# Patient Record
Sex: Male | Born: 1945 | Race: White | Hispanic: No | Marital: Married | Smoking: Former smoker
Health system: Southern US, Community
[De-identification: ages and names within clinical notes are randomized; demographics above are authoritative.]

## PROBLEM LIST (undated history)

## (undated) DIAGNOSIS — M353 Polymyalgia rheumatica: Secondary | ICD-10-CM

## (undated) DIAGNOSIS — H9190 Unspecified hearing loss, unspecified ear: Secondary | ICD-10-CM

## (undated) DIAGNOSIS — K635 Polyp of colon: Secondary | ICD-10-CM

## (undated) DIAGNOSIS — I480 Paroxysmal atrial fibrillation: Secondary | ICD-10-CM

## (undated) DIAGNOSIS — D219 Benign neoplasm of connective and other soft tissue, unspecified: Secondary | ICD-10-CM

## (undated) DIAGNOSIS — U071 COVID-19: Secondary | ICD-10-CM

## (undated) DIAGNOSIS — E785 Hyperlipidemia, unspecified: Secondary | ICD-10-CM

## (undated) DIAGNOSIS — E039 Hypothyroidism, unspecified: Secondary | ICD-10-CM

## (undated) DIAGNOSIS — K5730 Diverticulosis of large intestine without perforation or abscess without bleeding: Secondary | ICD-10-CM

## (undated) DIAGNOSIS — K648 Other hemorrhoids: Secondary | ICD-10-CM

## (undated) DIAGNOSIS — G4730 Sleep apnea, unspecified: Secondary | ICD-10-CM

## (undated) HISTORY — PX: INGUINAL HERNIA REPAIR: SHX194

## (undated) HISTORY — DX: Polyp of colon: K63.5

## (undated) HISTORY — DX: Sleep apnea, unspecified: G47.30

## (undated) HISTORY — PX: COLONOSCOPY W/ BIOPSIES: SHX1374

## (undated) HISTORY — DX: Hypothyroidism, unspecified: E03.9

## (undated) HISTORY — PX: ANKLE FUSION: SHX881

## (undated) HISTORY — DX: Paroxysmal atrial fibrillation: I48.0

## (undated) HISTORY — DX: Unspecified hearing loss, unspecified ear: H91.90

## (undated) HISTORY — DX: Other hemorrhoids: K64.8

## (undated) HISTORY — PX: OTHER SURGICAL HISTORY: SHX169

## (undated) HISTORY — DX: Hyperlipidemia, unspecified: E78.5

## (undated) HISTORY — DX: Diverticulosis of large intestine without perforation or abscess without bleeding: K57.30

## (undated) HISTORY — DX: Polymyalgia rheumatica: M35.3

## (undated) HISTORY — DX: Benign neoplasm of connective and other soft tissue, unspecified: D21.9

---

## 1998-08-04 ENCOUNTER — Other Ambulatory Visit: Admission: RE | Admit: 1998-08-04 | Discharge: 1998-08-04 | Payer: Self-pay | Admitting: Gastroenterology

## 1999-12-07 ENCOUNTER — Encounter: Payer: Self-pay | Admitting: Pulmonary Disease

## 1999-12-07 ENCOUNTER — Ambulatory Visit: Admission: RE | Admit: 1999-12-07 | Discharge: 1999-12-07 | Payer: Self-pay | Admitting: Family Medicine

## 2000-04-17 ENCOUNTER — Ambulatory Visit (HOSPITAL_BASED_OUTPATIENT_CLINIC_OR_DEPARTMENT_OTHER): Admission: RE | Admit: 2000-04-17 | Discharge: 2000-04-17 | Payer: Self-pay | Admitting: Pulmonary Disease

## 2000-04-17 ENCOUNTER — Encounter: Payer: Self-pay | Admitting: Pulmonary Disease

## 2004-06-18 ENCOUNTER — Ambulatory Visit: Payer: Self-pay | Admitting: Family Medicine

## 2004-06-26 ENCOUNTER — Ambulatory Visit: Payer: Self-pay | Admitting: Family Medicine

## 2004-07-09 ENCOUNTER — Ambulatory Visit: Payer: Self-pay | Admitting: Pulmonary Disease

## 2005-03-13 ENCOUNTER — Ambulatory Visit: Payer: Self-pay | Admitting: Family Medicine

## 2005-03-21 ENCOUNTER — Ambulatory Visit: Payer: Self-pay | Admitting: Family Medicine

## 2005-07-11 ENCOUNTER — Ambulatory Visit: Payer: Self-pay | Admitting: Emergency Medicine

## 2006-03-17 ENCOUNTER — Ambulatory Visit: Payer: Self-pay | Admitting: Family Medicine

## 2006-03-24 ENCOUNTER — Ambulatory Visit: Payer: Self-pay | Admitting: Family Medicine

## 2006-12-08 ENCOUNTER — Ambulatory Visit: Payer: Self-pay | Admitting: Pulmonary Disease

## 2007-01-05 ENCOUNTER — Ambulatory Visit: Payer: Self-pay | Admitting: Pulmonary Disease

## 2007-02-02 ENCOUNTER — Ambulatory Visit: Payer: Self-pay | Admitting: Pulmonary Disease

## 2007-05-28 DIAGNOSIS — G4733 Obstructive sleep apnea (adult) (pediatric): Secondary | ICD-10-CM | POA: Insufficient documentation

## 2007-06-03 ENCOUNTER — Ambulatory Visit: Payer: Self-pay | Admitting: Family Medicine

## 2007-06-03 LAB — CONVERTED CEMR LAB
Alkaline Phosphatase: 61 units/L (ref 39–117)
Basophils Relative: 0.5 % (ref 0.0–1.0)
Bilirubin, Direct: 0.2 mg/dL (ref 0.0–0.3)
CO2: 30 meq/L (ref 19–32)
Creatinine, Ser: 0.8 mg/dL (ref 0.4–1.5)
Eosinophils Relative: 0.9 % (ref 0.0–5.0)
GFR calc Af Amer: 126 mL/min
Glucose, Bld: 81 mg/dL (ref 70–99)
HCT: 44 % (ref 39.0–52.0)
HDL: 30.7 mg/dL — ABNORMAL LOW (ref >39.0)
Hemoglobin: 15.3 g/dL (ref 13.0–17.0)
Ketones, urine, test strip: NEGATIVE
LDL Cholesterol: 87 mg/dL (ref 0–99)
Lymphocytes Relative: 30.9 % (ref 12.0–46.0)
Monocytes Absolute: 0.4 10*3/uL (ref 0.2–0.7)
Monocytes Relative: 7.3 % (ref 3.0–11.0)
Neutro Abs: 3.4 10*3/uL (ref 1.4–7.7)
Neutrophils Relative %: 60.4 % (ref 43.0–77.0)
Nitrite: NEGATIVE
Potassium: 5.2 meq/L — ABNORMAL HIGH (ref 3.5–5.1)
RDW: 12.9 % (ref 11.5–14.6)
Sodium: 146 meq/L — ABNORMAL HIGH (ref 135–145)
Specific Gravity, Urine: 1.025
TSH: 1.6 microintl units/mL (ref 0.35–5.50)
Total Bilirubin: 0.9 mg/dL (ref 0.3–1.2)
Total Protein: 6.7 g/dL (ref 6.0–8.3)
VLDL: 18 mg/dL (ref 0–40)
WBC: 5.6 10*3/uL (ref 4.5–10.5)

## 2007-06-11 ENCOUNTER — Ambulatory Visit: Payer: Self-pay | Admitting: Family Medicine

## 2007-06-11 DIAGNOSIS — J309 Allergic rhinitis, unspecified: Secondary | ICD-10-CM | POA: Insufficient documentation

## 2007-08-28 ENCOUNTER — Ambulatory Visit: Payer: Self-pay | Admitting: Pulmonary Disease

## 2007-10-20 ENCOUNTER — Ambulatory Visit: Payer: Self-pay | Admitting: Family Medicine

## 2007-10-20 DIAGNOSIS — S43109A Unspecified dislocation of unspecified acromioclavicular joint, initial encounter: Secondary | ICD-10-CM | POA: Insufficient documentation

## 2007-10-27 ENCOUNTER — Ambulatory Visit: Payer: Self-pay | Admitting: Family Medicine

## 2007-12-11 ENCOUNTER — Ambulatory Visit (HOSPITAL_COMMUNITY): Admission: RE | Admit: 2007-12-11 | Discharge: 2007-12-11 | Payer: Self-pay | Admitting: Specialist

## 2007-12-31 ENCOUNTER — Encounter: Payer: Self-pay | Admitting: Family Medicine

## 2008-01-11 ENCOUNTER — Telehealth (INDEPENDENT_AMBULATORY_CARE_PROVIDER_SITE_OTHER): Payer: Self-pay | Admitting: *Deleted

## 2008-01-15 ENCOUNTER — Ambulatory Visit (HOSPITAL_COMMUNITY): Admission: RE | Admit: 2008-01-15 | Discharge: 2008-01-16 | Payer: Self-pay | Admitting: Specialist

## 2008-03-25 ENCOUNTER — Ambulatory Visit: Payer: Self-pay | Admitting: Internal Medicine

## 2008-04-08 ENCOUNTER — Ambulatory Visit: Payer: Self-pay | Admitting: Internal Medicine

## 2008-08-15 ENCOUNTER — Ambulatory Visit: Payer: Self-pay | Admitting: Family Medicine

## 2008-08-15 LAB — CONVERTED CEMR LAB
ALT: 27 units/L (ref 0–53)
AST: 27 units/L (ref 0–37)
Albumin: 3.9 g/dL (ref 3.5–5.2)
Alkaline Phosphatase: 62 units/L (ref 39–117)
BUN: 16 mg/dL (ref 6–23)
Basophils Relative: 0.3 % (ref 0.0–3.0)
Blood in Urine, dipstick: NEGATIVE
CO2: 30 meq/L (ref 19–32)
Chloride: 107 meq/L (ref 96–112)
Creatinine, Ser: 0.8 mg/dL (ref 0.4–1.5)
Eosinophils Relative: 0.7 % (ref 0.0–5.0)
Glucose, Bld: 100 mg/dL — ABNORMAL HIGH (ref 70–99)
Glucose, Urine, Semiquant: NEGATIVE
HCT: 43.2 % (ref 39.0–52.0)
HDL: 28.6 mg/dL — ABNORMAL LOW (ref >39.0)
MCV: 89.5 fL (ref 78.0–100.0)
Monocytes Relative: 7.3 % (ref 3.0–12.0)
Neutrophils Relative %: 59.2 % (ref 43.0–77.0)
Nitrite: NEGATIVE
PSA: 1.11 ng/mL (ref 0.10–4.00)
Platelets: 158 10*3/uL (ref 150–400)
Potassium: 5.3 meq/L — ABNORMAL HIGH (ref 3.5–5.1)
RBC: 4.82 M/uL (ref 4.22–5.81)
Specific Gravity, Urine: 1.02
Total CHOL/HDL Ratio: 4.4
Total Protein: 7.1 g/dL (ref 6.0–8.3)
VLDL: 12 mg/dL (ref 0–40)
WBC Urine, dipstick: NEGATIVE
WBC: 4.9 10*3/uL (ref 4.5–10.5)
pH: 6

## 2008-08-22 ENCOUNTER — Ambulatory Visit: Payer: Self-pay | Admitting: Family Medicine

## 2009-03-24 ENCOUNTER — Ambulatory Visit: Payer: Self-pay | Admitting: Pulmonary Disease

## 2009-03-27 ENCOUNTER — Telehealth (INDEPENDENT_AMBULATORY_CARE_PROVIDER_SITE_OTHER): Payer: Self-pay | Admitting: *Deleted

## 2009-04-18 ENCOUNTER — Encounter: Payer: Self-pay | Admitting: Pulmonary Disease

## 2009-05-12 ENCOUNTER — Encounter: Payer: Self-pay | Admitting: Pulmonary Disease

## 2009-10-09 ENCOUNTER — Ambulatory Visit: Payer: Self-pay | Admitting: Family Medicine

## 2010-05-25 ENCOUNTER — Ambulatory Visit: Payer: Self-pay | Admitting: Pulmonary Disease

## 2010-05-28 ENCOUNTER — Ambulatory Visit: Payer: Self-pay | Admitting: Family Medicine

## 2010-05-28 ENCOUNTER — Encounter: Payer: Self-pay | Admitting: Family Medicine

## 2010-05-28 DIAGNOSIS — N529 Male erectile dysfunction, unspecified: Secondary | ICD-10-CM | POA: Insufficient documentation

## 2010-05-28 LAB — CONVERTED CEMR LAB
Bilirubin Urine: NEGATIVE
Nitrite: NEGATIVE
Protein, U semiquant: NEGATIVE
Urobilinogen, UA: 0.2
WBC Urine, dipstick: NEGATIVE

## 2010-05-29 LAB — CONVERTED CEMR LAB
ALT: 32 units/L (ref 0–53)
AST: 29 units/L (ref 0–37)
BUN: 17 mg/dL (ref 6–23)
Bilirubin, Direct: 0.1 mg/dL (ref 0.0–0.3)
Cholesterol: 164 mg/dL (ref 0–200)
Eosinophils Relative: 1.5 % (ref 0.0–5.0)
GFR calc non Af Amer: 102.62 mL/min (ref >60)
HCT: 46 % (ref 39.0–52.0)
LDL Cholesterol: 112 mg/dL — ABNORMAL HIGH (ref 0–99)
Lymphs Abs: 1.9 10*3/uL (ref 0.7–4.0)
Monocytes Relative: 8 % (ref 3.0–12.0)
Neutrophils Relative %: 59.8 % (ref 43.0–77.0)
Platelets: 173 10*3/uL (ref 150.0–400.0)
Potassium: 5.3 meq/L — ABNORMAL HIGH (ref 3.5–5.1)
RBC: 5 M/uL (ref 4.22–5.81)
Total Bilirubin: 0.6 mg/dL (ref 0.3–1.2)
Total CHOL/HDL Ratio: 4
VLDL: 15 mg/dL (ref 0.0–40.0)
WBC: 6.2 10*3/uL (ref 4.5–10.5)

## 2010-08-28 NOTE — Assessment & Plan Note (Signed)
Summary: COUGH, CONGESTION, LG FEVER // RS   Vital Signs:  Patient profile:   65 year old male Weight:      196 pounds Temp:     99.2 degrees F oral BP sitting:   110 / 80  (left arm)  Vitals Entered By: Kern Reap CMA Duncan Dull) (October 09, 2009 11:35 AM)  Reason for Visit cough  Primary Care Provider:  Tawanna Cooler   History of Present Illness: Jeremy Cardenas is a 65 year old, married male, nonsmoker, and comes in today for evaluation of a cough x 2 weeks.  His daughter and wife had mycoplasma.   A set cough with no fever, chills, earache, sore throat for about two weeks.  Over the weekend, he developed some chills and a temperature of 100 degrees.  Review of systems negative  Allergies: No Known Drug Allergies  Past History:  Past medical, surgical, family and social histories (including risk factors) reviewed for relevance to current acute and chronic problems.  Past Medical History: Reviewed history from 06/11/2007 and no changes required. Sleep Apnea Allergic rhinitis  Past Surgical History: Reviewed history from 03/24/2009 and no changes required. Rotator cuff repair o9  Family History: Reviewed history from 03/24/2007 and no changes required. Family History Breast cancer 1st degree relative <50 Family History Hypertension Family History of Stroke M 1st degree relative <50 Family History of Cardiovascular disorder  Social History: Reviewed history from 08/22/2008 and no changes required. Married Former Smoker Alcohol use-yes Drug use-no Retired Regular exercise-yes part-time at [employer]  Review of Systems      See HPI  Physical Exam  General:  Well-developed,well-nourished,in no acute distress; alert,appropriate and cooperative throughout examination Head:  Normocephalic and atraumatic without obvious abnormalities. No apparent alopecia or balding. Eyes:  No corneal or conjunctival inflammation noted. EOMI. Perrla. Funduscopic exam benign, without  hemorrhages, exudates or papilledema. Vision grossly normal. Ears:  External ear exam shows no significant lesions or deformities.  Otoscopic examination reveals clear canals, tympanic membranes are intact bilaterally without bulging, retraction, inflammation or discharge. Hearing is grossly normal bilaterally. Nose:  External nasal examination shows no deformity or inflammation. Nasal mucosa are pink and moist without lesions or exudates. Mouth:  Oral mucosa and oropharynx without lesions or exudates.  Teeth in good repair. Neck:  No deformities, masses, or tenderness noted. Chest Wall:  No deformities, masses, tenderness or gynecomastia noted. Lungs:  crackles right base   Problems:  Medical Problems Added: 1)  Dx of Cough  (ICD-786.2)  Impression & Recommendations:  Problem # 1:  COUGH (ICD-786.2) Assessment New  Complete Medication List: 1)  Adult Aspirin Ec Low Strength 81 Mg Tbec (Aspirin) .... Once daily 2)  Saw Palmetto 160 Mg Caps (Saw palmetto (serenoa repens)) .... Take 1 tablet by mouth two times a day 3)  Red Yeast Rice, Vit E  .... Once daily 4)  Cvs Stool Softener 100 Mg Caps (Docusate sodium) .... Three times a day 5)  Eql Fish Oil 1000 Mg Caps (Omega-3 fatty acids) .... Once daily 6)  Glucosamine 1500 Complex Caps (Glucosamine-chondroit-vit c-mn) .... Once daily 7)  Doxycycline Hyclate 100 Mg Caps (Doxycycline hyclate) .... Take 1 tablet by mouth two times a day  Patient Instructions: 1)  begin doxycycline 100 mg b.i.d. x 2 weeks.  Return p.r.n. Prescriptions: DOXYCYCLINE HYCLATE 100 MG CAPS (DOXYCYCLINE HYCLATE) Take 1 tablet by mouth two times a day  #30 x 1   Entered and Authorized by:   Roderick Pee MD  Signed by:   Roderick Pee MD on 10/09/2009   Method used:   Print then Give to Patient   RxID:   [id number]

## 2010-08-28 NOTE — Assessment & Plan Note (Signed)
Summary: 1 year / mbw   Primary Provider/Referring Provider:  Tawanna Cooler   History of Present Illness: 64/M  for F/U for OSA.  Well maintained on CPAP 8 cm H2O, nasal pillows with chin strap.  Compliant with CPAP.  No daytime hypersomnolence.  Last seen by DS in 1/09. PSg was performed in 2001.5-10 lb wt gain since then. Epworth Sleepiness Score 6 Uses flonase at bedtime. Reviewed PSG 5/01 >> baseline RDI 52/h corrected by CPAP 7 cm 9/01  May 25, 2010 9:15 AM  feels well, mask ok, pressure ok, wt unchanged, denies excessive daytime somnolence, snoring nasonex working ok.  Preventive Screening-Counseling & Management  Alcohol-Tobacco     Alcohol drinks/day: <1     Alcohol type: wine     Smoking Status: quit     Packs/Day: 1.0     Year Started: 1961     Year Quit: 1984  Allergies (verified): No Known Drug Allergies  Past History:  Past Medical History: Last updated: 06/11/2007 Sleep Apnea Allergic rhinitis  Social History: Last updated: 08/22/2008 Married Former Smoker Alcohol use-yes Drug use-no Retired Regular exercise-yes part-time at [employer]  Review of Systems  The patient denies anorexia, fever, weight loss, weight gain, vision loss, decreased hearing, hoarseness, chest pain, syncope, dyspnea on exertion, peripheral edema, prolonged cough, headaches, hemoptysis, abdominal pain, melena, hematochezia, severe indigestion/heartburn, hematuria, muscle weakness, suspicious skin lesions, difficulty walking, depression, unusual weight change, abnormal bleeding, enlarged lymph nodes, and angioedema.    Vital Signs:  Patient profile:   65 year old male Height:      68.25 inches Weight:      202.2 pounds BMI:     30.63 O2 Sat:      95 % on Room air Temp:     97.5 degrees F oral Pulse rate:   70 / minute BP sitting:   110 / 62  (right arm) Cuff size:   regular  Vitals Entered By: Zackery Barefoot CMA (May 25, 2010 9:10 AM)  O2 Flow:  Room  air Comments Medications reviewed with patient Verified contact number and pharmacy with patient Zackery Barefoot CMA  May 25, 2010 9:11 AM    Physical Exam  Additional Exam:  Gen. Pleasant, well-nourished, in no distress ENT - no lesions, no post nasal drip Neck: No JVD, no thyromegaly, no carotid bruits Lungs: no use of accessory muscles, no dullness to percussion, clear without rales or rhonchi  Cardiovascular: Rhythm regular, heart sounds  normal, no murmurs or gallops, no peripheral edema Musculoskeletal: No deformities, no cyanosis or clubbing      Impression & Recommendations:  Problem # 1:  OBSTRUCTIVE SLEEP APNEA (ICD-327.23)  Compliance encouraged, wt loss emphasized, asked to avoid meds with sedative side effects, cautioned against driving when sleepy.  CMN will be renewed. ct CPAP 8 cm  Orders: Est. Patient Level III (60454)  Medications Added to Medication List This Visit: 1)  Red Yeast Rice 600 Mg Caps (Red yeast rice extract) .... Take 2 tablet by mouth two times a day 2)  Nasonex 50 Mcg/act Susp (Mometasone furoate) .... One spray each nostril once daily  Patient Instructions: 1)  Copy sent to: dr todd 2)  Please schedule a follow-up appointment in 1 year.   Not Administered:    Influenza Vaccine not given due to: declined

## 2010-08-28 NOTE — Assessment & Plan Note (Signed)
Summary: emp---will fast//ccm   Vital Signs:  Patient profile:   65 year old male Height:      68 inches Weight:      201 pounds Temp:     98.4 degrees F oral BP sitting:   110 / 74  (left arm) Cuff size:   regular  Vitals Entered By: Kern Reap CMA Duncan Dull) (May 28, 2010 8:35 AM) CC: annual wellness exam Is Patient Diabetic? No Pain Assessment Patient in pain? no        Primary Care Provider:  Tawanna Cooler  CC:  annual wellness exam.  History of Present Illness: Jeremy Cardenas is a 65 year old male, married, nonsmoker, who comes in today for general Medicare wellness exam.  As always been in excellent health.  Has had no chronic health problems.  He takes an aspirin tablet daily, and steroid nasal spray p.r.n. for allergic rhinitis.  He gets routine eye care, dental care, colonoscopy, normal, tetanus, 2010, seasonal flu and Pneumovax today, information given on shingles, he is requesting a prescription for V.  Here for Medicare AWV:  1.   Risk factors based on Past M, S, F history:...reviewed no changes 2.   Physical Activities: walks daily 3.   Depression/mood: good mood.  No depression 4.   Hearing: normal 5.   ADL's: normal 6.   Fall Risk: reviewed and identified 7.   Home Safety: normal.  No guns in the house 8.   Height, weight, &visual acuity:height weight, normal annual eye exam 9.   Counseling: continue good health habits 10.   Labs ordered based on risk factors: done today 11.           Referral Coordination.........none indicated 12.           Care Plan.........continue current program 13.            Cognitive Assessment .......Marland Kitchenoriented x 3 does all his own finances independently  Allergies: No Known Drug Allergies  Past History:  Past medical, surgical, family and social histories (including risk factors) reviewed, and no changes noted (except as noted below).  Past Medical History: Reviewed history from 06/11/2007 and no changes required. Sleep  Apnea Allergic rhinitis  Past Surgical History: Reviewed history from 03/24/2009 and no changes required. Rotator cuff repair o9  Family History: Reviewed history from 03/24/2007 and no changes required. Family History Breast cancer 1st degree relative <50 Family History Hypertension Family History of Stroke M 1st degree relative <50 Family History of Cardiovascular disorder  Social History: Reviewed history from 08/22/2008 and no changes required. Married Former Smoker Alcohol use-yes Drug use-no Retired Regular exercise-yes part-time at [employer]  Review of Systems      See HPI       Flu Vaccine Consent Questions     Do you have a history of severe allergic reactions to this vaccine? no    Any prior history of allergic reactions to egg and/or gelatin? no    Do you have a sensitivity to the preservative Thimersol? no    Do you have a past history of Guillan-Barre Syndrome? no    Do you currently have an acute febrile illness? no    Have you ever had a severe reaction to latex? no    Vaccine information given and explained to patient? yes    Are you currently pregnant? no    Lot Number:[id number]   Exp Date:01/26/2011   Site Given  Left Deltoid IM   Physical Exam  General:  Well-developed,well-nourished,in no  acute distress; alert,appropriate and cooperative throughout examination Head:  Normocephalic and atraumatic without obvious abnormalities. No apparent alopecia or balding. Eyes:  No corneal or conjunctival inflammation noted. EOMI. Perrla. Funduscopic exam benign, without hemorrhages, exudates or papilledema. Vision grossly normal. Ears:  External ear exam shows no significant lesions or deformities.  Otoscopic examination reveals clear canals, tympanic membranes are intact bilaterally without bulging, retraction, inflammation or discharge. Hearing is grossly normal bilaterally. Nose:  External nasal examination shows no deformity or inflammation. Nasal  mucosa are pink and moist without lesions or exudates. Mouth:  Oral mucosa and oropharynx without lesions or exudates.  Teeth in good repair. Neck:  No deformities, masses, or tenderness noted. Chest Wall:  No deformities, masses, tenderness or gynecomastia noted. Breasts:  No masses or gynecomastia noted Lungs:  Normal respiratory effort, chest expands symmetrically. Lungs are clear to auscultation, no crackles or wheezes. Heart:  Normal rate and regular rhythm. S1 and S2 normal without gallop, murmur, click, rub or other extra sounds. Abdomen:  Bowel sounds positive,abdomen soft and non-tender without masses, organomegaly or hernias noted. Rectal:  No external abnormalities noted. Normal sphincter tone. No rectal masses or tenderness. Genitalia:  Testes bilaterally descended without nodularity, tenderness or masses. No scrotal masses or lesions. No penis lesions or urethral discharge. Prostate:  Prostate gland firm and smooth, no enlargement, nodularity, tenderness, mass, asymmetry or induration. Msk:  No deformity or scoliosis noted of thoracic or lumbar spine.   Pulses:  R and L carotid,radial,femoral,dorsalis pedis and posterior tibial pulses are full and equal bilaterally Extremities:  No clubbing, cyanosis, edema, or deformity noted with normal full range of motion of all joints.   Neurologic:  No cranial nerve deficits noted. Station and gait are normal. Plantar reflexes are down-going bilaterally. DTRs are symmetrical throughout. Sensory, motor and coordinative functions appear intact. Skin:  Intact without suspicious lesions or rashes Cervical Nodes:  No lymphadenopathy noted Axillary Nodes:  No palpable lymphadenopathy Inguinal Nodes:  No significant adenopathy Psych:  Cognition and judgment appear intact. Alert and cooperative with normal attention span and concentration. No apparent delusions, illusions, hallucinations   Impression & Recommendations:  Problem # 1:  PHYSICAL  EXAMINATION, NORMAL (ICD-V70.0) Assessment Unchanged  Orders: Prescription Created Electronically [phone]) Medicare -1st Annual Wellness Visit [phone]) Urinalysis-dipstick only (Medicare patient) ([id number]) Venipuncture (41660) TLB-Lipid Panel (80061-LIPID) TLB-BMP (Basic Metabolic Panel-BMET) (80048-METABOL) TLB-CBC Platelet - w/Differential (85025-CBCD) TLB-Hepatic/Liver Function Pnl (80076-HEPATIC) TLB-TSH (Thyroid Stimulating Hormone) (84443-TSH) TLB-PSA (Prostate Specific Antigen) (84153-PSA) Specimen Handling (63016) EKG w/ Interpretation (93000)  Problem # 2:  ALLERGIC RHINITIS (ICD-477.9) Assessment: Improved  The following medications were removed from the medication list:    Nasonex 50 Mcg/act Susp (Mometasone furoate) ..... One spray each nostril once daily His updated medication list for this problem includes:    Flonase 50 Mcg/act Susp (Fluticasone propionate) ..... Uad  Orders: Prescription Created Electronically [phone]) Medicare -1st Annual Wellness Visit [phone]) Urinalysis-dipstick only (Medicare patient) [phone]) Venipuncture [phone]) TLB-Lipid Panel (80061-LIPID) TLB-BMP (Basic Metabolic Panel-BMET) (80048-METABOL) TLB-CBC Platelet - w/Differential (85025-CBCD) TLB-Hepatic/Liver Function Pnl (80076-HEPATIC) TLB-TSH (Thyroid Stimulating Hormone) (84443-TSH) TLB-PSA (Prostate Specific Antigen) (84153-PSA) Specimen Handling (37628)  Complete Medication List: 1)  Adult Aspirin Ec Low Strength 81 Mg Tbec (Aspirin) .... Once daily 2)  Saw Palmetto 160 Mg Caps (Saw palmetto (serenoa repens)) .... Take 1 tablet by mouth two times a day 3)  Red Yeast Rice 600 Mg Caps (Red yeast rice extract) .... Take 2 tablet by mouth two times a day 4)  Cvs Stool  Softener 100 Mg Caps (Docusate sodium) .... Three times a day 5)  Eql Fish Oil 1000 Mg Caps (Omega-3 fatty acids) .... Once daily 6)  Glucosamine 1500 Complex Caps (Glucosamine-chondroit-vit c-mn) .... Once daily 7)   Flonase 50 Mcg/act Susp (Fluticasone propionate) .... Uad 8)  Viagra 100 Mg Tabs (Sildenafil citrate) .... Uad  Other Orders: Flu Vaccine 79yrs + MEDICARE PATIENTS (D6387) Administration Flu vaccine - MCR (F6433) Pneumococcal Vaccine (29518) Admin 1st Vaccine (84166)  Patient Instructions: 1)  Please schedule a follow-up appointment in 1 year. 2)  It is important that you exercise regularly at least 20 minutes 5 times a week. If you develop chest pain, have severe difficulty breathing, or feel very tired , stop exercising immediately and seek medical attention. 3)  Take an Aspirin every day. Prescriptions: VIAGRA 100 MG TABS (SILDENAFIL CITRATE) UAD  #6 x 11   Entered and Authorized by:   Roderick Pee MD   Signed by:   Roderick Pee MD on 05/28/2010   Method used:   Electronically to        [facility]  [address] [id number] (retail)       [address]       [place], [place]  [address]       Ph: [phone] or [phone]       Fax: [phone]   RxID:   [id number] FLONASE 50 MCG/ACT SUSP (FLUTICASONE PROPIONATE) UAD  #3 units x 4   Entered and Authorized by:   Roderick Pee MD   Signed by:   Roderick Pee MD on 05/28/2010   Method used:   [facility]ically to        [facility]  [address] [id number] ([facility]       [address]       [place], [place]  [address]       Ph: [phone] or [phone]       Fax: [phone]   RxID:   [id number]    Orders Added: 1)  Prescription Created Electronically [phone] 2)  Medicare -1st Annual Wellness Visit [G0438] 3)  Urinalysis-dipstick only (Medicare patient) [[id number]] 4)  Venipuncture [36415] 5)  TLB-Lipid Panel [80061-LIPID] 6)  TLB-BMP (Basic Metabolic Panel-BMET) [80048-METABOL] 7)  TLB-CBC Platelet - w/Differential [85025-CBCD] 8)  TLB-Hepatic/Liver Function Pnl [80076-HEPATIC] 9)  TLB-TSH (Thyroid Stimulating Hormone) [84443-TSH] 10)  TLB-PSA (Prostate Specific Antigen) [84153-PSA] 11)   Specimen Handling [99000] 12)  Flu Vaccine 65yrs + MEDICARE PATIENTS [Q2039] 13)  Administration Flu vaccine - MCR [G0008] 14)  EKG w/ Interpretation [93000] 15)  Pneumococcal Vaccine [90732] 16)  Admin 1st Vaccine [76195]   Immunizations Administered:  Pneumonia Vaccine:    Vaccine Type: Pneumovax    Site: right deltoid    Mfr: Merck    Dose: 0.5 ml    Route: IM    Given by: Kern Reap CMA (AAMA)    Exp. Date: 11/20/2011    Lot #: [id number]    Physician counseled: yes   Immunizations Administered:  Pneumonia Vaccine:    Vaccine Type: Pneumovax    Site: right deltoid    Mfr: Merck    Dose: 0.5 ml    Route: IM    Given by: Kern Reap CMA (AAMA)    Exp. Date: 11/20/2011    Lot #: [id number]    Physician counseled: yes   Laboratory Results   Urine Tests  Date/Time Recieved: May 28, 2010 11:25 AM  Date/Time Reported: May 28, 2010 11:25 AM   Routine Urinalysis   Color: yellow Appearance: Clear Glucose: negative   (Normal Range: Negative) Bilirubin: negative   (Normal Range: Negative) Ketone: negative   (Normal Range: Negative) Spec. Gravity: 1.020   (Normal Range: 1.003-1.035) Blood: negative   (Normal Range: Negative) pH: 7.0   (Normal Range: 5.0-8.0) Protein: negative   (Normal Range: Negative) Urobilinogen: 0.2   (Normal Range: 0-1) Nitrite: negative   (Normal Range: Negative) Leukocyte Esterace: negative   (Normal Range: Negative)    Comments: Wynona Canes, CMA  May 28, 2010 11:25 AM

## 2010-12-11 NOTE — Op Note (Signed)
NAME:  Jeremy Cardenas, Jeremy Cardenas NO.:  [id number]   MEDICAL RECORD NO.:  [id number]          PATIENT TYPE:  AMB   LOCATION:  DAY                          FACILITY:  [facility]   PHYSICIAN:  Jene Every, M.D.    DATE OF BIRTH:  1945/08/14   DATE OF PROCEDURE:  01/15/2008  DATE OF DISCHARGE:                               OPERATIVE REPORT   PREOPERATIVE DIAGNOSIS:  Mass of rotator cuff tear of the right  shoulder.   POSTOPERATIVE DIAGNOSIS:  Mass of rotator cuff tear of the right  shoulder.   PROCEDURE PERFORMED:  1. Open rotator cuff repair.  2. Subacromial decompression with augmentation of rotator cuff with a      TissueMend patch graft.   ANESTHESIA:  General.   ASSISTANT:  Roma Schanz, P.A.   BLOOD LOSS:  Minimal.   COMPLICATIONS:  None.   BRIEF HISTORY AND INDICATIONS:  The patient is a 65 year old with a  rotator cuff tear, retracted, but no evidence of atrophy to the  glenohumeral joint.  He had weakness and then pain.  He is indicated for  repair of the rotator cuff.  We discussed risks and benefits including  bleeding, infection, inability to repair, need for a patch graft,  prolonged immobilization, need for hemiarthroplasty, etc., in the future  as well as infection, perioperative complications, etc.   TECHNIQUE:  The patient was placed in the supine beach-chair position.  After the induction of adequate anesthesia and 1 g of Kefzol, the right  shoulder and upper extremity was prepped and draped in the usual sterile  fashion.  Surgical incisions were  made over the anterolateral aspect of  the acromion.  Subcutaneous tissue was dissected.  Electrocautery was  utilized to achieve hemostasis.  The raphe between the anterolateral  heads was identified and divided for approximately 3-cm and  subperiosteal elevated from the anterolateral and anteromedial aspect of  the acromion.  The CA ligament was divided.  We preserved the attachment  of the deltoid.   Noted was a retracted tear of the rotator cuff with  exposed the articular surface of the humerus.  The joint was copiously  irrigated.  We retracted the supraspinatus and the subscap, little to no  of the infraspinatus.  The cuff was digitally mobilized.  A bed was then  fashioned between the articular surface laterally and the greater  tuberosity with a Matt Holmes rongeur.  We also removed a centimeter of the  lateral aspect of the articular surface to improve the surface area and  the footprint for the rotator cuff tendon, as it was somewhat attenuated  and retracted.  I then placed 2 suture anchors at the native articular  surface edge.  The tendon, especially the posterior aspect of  supraspinatus tendon, was attenuated.  I felt that this would, if a  successful repair was to be obtained, require augmentation with a graft  using a TissueMend graft.  This was reconstituted appropriately.  I  placed in the suture anchors and threaded the 2 arms of the sutures  through the tendon at that surface.  This was then tied  with a surgeon's  knot with the arm in the abducted position.  Then, with a free needle, I  threaded this through the contoured TissueMend and the graft was then  pushed down over the suture arms.  They were crossed and then I laid  them down over the rotator cuff and the TissueMend and then secured them  over the lateral aspect of the greater tuberosity with 2 PushLock suture  anchors after tapping with a 3/5 awl and inserting the eyelets as they  crossed into each and tapping them into the channel formed by the awl  with excellent purchase.  This created a double row fixation for the  rotator cuff and secured the TissueMend underneath it.  There was  satisfactory coverage there actually in the supraspinatus medially and  the subscap.  This was then reinforced with 3-0 Vicryl interrupted  simple sutures to secure the lateral aspects of the TissueMend with the  native cuff.  The  wound was copiously irrigated.  I gently took the arm  down to the side and then abducted it to 90 degrees with preservation of  the continuity of the repair.  I then repaired the raphe with  #1-Vicryl  interrupted figure-of-eight sutures over and through the acromion  securing the deltoid.  The subcutaneous tissue was reapproximated with 2-  0 Vicryl simple sutures and the skin was reapproximated with 4-0  subcuticular Prolene.  The wound was reinforced with Steri-Strips,  sterile dressing applied and he was placed on a large abduction pillow,  extubated without difficulty and transported to the recovery room in  satisfactory condition.  The patient tolerated the procedure well.            Jene Every, M.D.  Electronically Signed     JB/MEDQ  D:  01/15/2008  T:  01/15/2008  Job:  [id number]

## 2010-12-11 NOTE — Assessment & Plan Note (Signed)
Jeremy Cardenas                             PULMONARY OFFICE NOTE   Jeremy Cardenas, Jeremy Cardenas                    MRN:          [id number]  DATE:12/08/2006                            DOB:          March 01, 1946    PROBLEM:  Obstructive sleep apnea.   HISTORY OF PRESENT ILLNESS:  Jeremy Cardenas is a 65 year old gentleman who  I last saw several years ago for obstructive sleep apnea. He was also  seen by Dr. Delton Coombes in December 2006 which is his last encounter with  [facility]. He has been maintained on nasal CPAP at 8 cmH2O  pressure. He wears nasal pillows. He had equipment malfunction and  contacted [facility] to get replacement. They noted that it had  been greater than 1 year since his last evaluation and recommended  followup with [place]. He states that his obstructive sleep apnea has been  extremely well controlled with the nasal CPAP and he has been notably  compliant with this. He has not used any in the past 4 nights and has  noted some increased daytime hypersomnolence.   PAST MEDICAL HISTORY:  Notable for sleep and allergies.   CURRENT MEDICATIONS:  He only uses Nasonex.   SOCIAL HISTORY:  Remote smoking history and quit in 1983. No history of  significant alcohol abuse. No history of significant occupational  exposures.   FAMILY HISTORY:  This has been reviewed on the intake form and is  noncontributory.   REVIEW OF SYSTEMS:  A detailed review of systems has been performed and  is noncontributory.   PHYSICAL EXAMINATION:  VITAL SIGNS:  Temperature is 97.6, blood pressure  114/62, pulse 70 and regular, respirations 16 and unlabored, room air  oxygen saturation is 96%.  GENERAL:  He is well-developed, well-nourished in no acute cardiac or  respiratory distress. His weight today is 191 pounds which is down 5  pounds from our last encounter.  HEENT:  No acute abnormalities.  NECK:  Supple without adenopathy or jugular venous  distention.  CHEST:  Reveals full breath sounds with no adventitious sounds,  percussion note is normal.  CARDIAC:  Reveals a regular rate and rhythm with no murmurs.  ABDOMEN:  Soft, nontender with normal bowel sounds.  EXTREMITIES:  Without clubbing, cyanosis or edema.  NEUROLOGIC:  Reveals no focal deficits.   LABORATORY DATA:  A polysomnogram from 2001 revealed severe obstructive  sleep apnea. CPAP titration study around that same time demonstrates  excellent control of the sleep apnea with 8 cmH2O pressure.   IMPRESSION:  Obstructive sleep apnea - well controlled on nasal CPAP  using the nasal pillows. Now with equipment malfunction requiring  replacement.   PLAN:  We will contact [facility] to replace his equipment. He  needs a chin strap and a reevaluation of his machine as well as nasal  pillow device. Because it has been so many years since his last study,  we will also have them perform an autotitration study for 2 weeks to  ensure that 8 cmH2O pressure is still the proper setting.     Jeremy Hua  Ree Kida, MD  Electronically Signed    DBS/MedQ  DD: 12/24/2006  DT: 12/24/2006  Job #: [id number]

## 2010-12-11 NOTE — Assessment & Plan Note (Signed)
[facility]                             PULMONARY OFFICE NOTE   DAARON, DIMARCO                    MRN:          [id number]  DATE:01/23/2007                            DOB:          1945/09/12    Mr. Romanoski called saying that he feels his CPAP pressure is too high.  I reviewed his auto CPAP titration and while his 90th percentile  pressure setting was 12, it appeared that he did not actually have much  time at either CPAP of 11 or 12.  The majority of the time was actually  spent below 10.  I will therefore decrease his pressure setting to 10  and then arranged for him to have a follow-up visit to determine if he  would need to have a repeat in-lab CPAP titration study versus  consideration for BiPAP therapy.     Coralyn Helling, MD  Electronically Signed    VS/MedQ  DD: 01/23/2007  DT: 01/23/2007  Job #: [id number]   cc:   Oley Balm. Sung Amabile, MD

## 2011-04-25 LAB — BASIC METABOLIC PANEL
BUN: 13
Calcium: 9.1
GFR calc non Af Amer: >60
Glucose, Bld: 94
Sodium: 142

## 2011-04-25 LAB — CBC
Platelets: 162
RDW: 13.8
WBC: 5.3

## 2011-04-25 LAB — DIFFERENTIAL
Lymphocytes Relative: 32
Lymphs Abs: 1.7
Neutrophils Relative %: 60

## 2011-07-01 ENCOUNTER — Encounter: Payer: Self-pay | Admitting: *Deleted

## 2011-07-02 ENCOUNTER — Ambulatory Visit (INDEPENDENT_AMBULATORY_CARE_PROVIDER_SITE_OTHER): Payer: Medicare Other | Admitting: Pulmonary Disease

## 2011-07-02 ENCOUNTER — Encounter: Payer: Self-pay | Admitting: Pulmonary Disease

## 2011-07-02 VITALS — BP 110/62 | HR 70 | Temp 98.1°F | Ht 69.0 in | Wt 206.0 lb

## 2011-07-02 DIAGNOSIS — Z23 Encounter for immunization: Secondary | ICD-10-CM

## 2011-07-02 DIAGNOSIS — G4733 Obstructive sleep apnea (adult) (pediatric): Secondary | ICD-10-CM

## 2011-07-02 NOTE — Progress Notes (Deleted)
Patient ID: Jeremy Cardenas, male   DOB: 26-Jan-1946, 65 y.o.   MRN: [id number]

## 2011-07-02 NOTE — Patient Instructions (Addendum)
Call us if you desire change in supplier Flu shot

## 2011-07-02 NOTE — Progress Notes (Signed)
  Subjective:    Patient ID: Jeremy Cardenas, male    DOB: 16-Apr-1946, 65 y.o.   MRN: [id number]  HPI 64/M for F/U for OSA. Well maintained on CPAP 8 cm H2O, nasal pillows with chin strap. Compliant with CPAP. No daytime hypersomnolence. Last seen by DS in 1/09.  PSg was performed in 2001.5-10 lb wt gain since then. Epworth Sleepiness Score 6  Uses flonase at bedtime.  Reviewed PSG 5/01 >> baseline RDI 52/h corrected by CPAP 7 cm 9/01   07/02/2011 1 yr FU feels well, mask ok, pressure ok, wt unchanged, denies excessive daytime somnolence, snoring  nasonex working ok. Has not needed new supplies - wonders if he should change supplier Gained 10 lbs Pneumovax 2011   Review of Systems Patient denies significant dyspnea,cough, hemoptysis,  chest pain, palpitations, pedal edema, orthopnea, paroxysmal nocturnal dyspnea, lightheadedness, nausea, vomiting, abdominal or  leg pains      Objective:   Physical Exam  Gen. Pleasant, well-nourished, in no distress ENT - no lesions, no post nasal drip Neck: No JVD, no thyromegaly, no carotid bruits Lungs: no use of accessory muscles, no dullness to percussion, clear without rales or rhonchi  Cardiovascular: Rhythm regular, heart sounds  normal, no murmurs or gallops, no peripheral edema Musculoskeletal: No deformities, no cyanosis or clubbing         Assessment & Plan:

## 2011-07-02 NOTE — Assessment & Plan Note (Signed)
PSG 5/01 >> RDI 52/h corrected by CPAP 7 cm 9/01  Maintained on 8 cm Weight loss encouraged, compliance with goal of at least 4-6 hrs every night is the expectation. Advised against medications with sedative side effects Cautioned against driving when sleepy - understanding that sleepiness will vary on a day to day basis Flu shot today

## 2011-07-02 NOTE — Progress Notes (Signed)
Addended by: Julaine Hua on: 07/02/2011 03:09 PM   Modules accepted: Orders

## 2011-10-21 ENCOUNTER — Ambulatory Visit (INDEPENDENT_AMBULATORY_CARE_PROVIDER_SITE_OTHER): Payer: Medicare Other | Admitting: Family Medicine

## 2011-10-21 ENCOUNTER — Encounter: Payer: Self-pay | Admitting: Family Medicine

## 2011-10-21 VITALS — BP 100/74 | Temp 98.3°F | Ht 68.25 in | Wt 197.0 lb

## 2011-10-21 DIAGNOSIS — N401 Enlarged prostate with lower urinary tract symptoms: Secondary | ICD-10-CM

## 2011-10-21 DIAGNOSIS — R5383 Other fatigue: Secondary | ICD-10-CM

## 2011-10-21 DIAGNOSIS — N529 Male erectile dysfunction, unspecified: Secondary | ICD-10-CM

## 2011-10-21 DIAGNOSIS — R5381 Other malaise: Secondary | ICD-10-CM

## 2011-10-21 DIAGNOSIS — R351 Nocturia: Secondary | ICD-10-CM

## 2011-10-21 DIAGNOSIS — N138 Other obstructive and reflux uropathy: Secondary | ICD-10-CM

## 2011-10-21 DIAGNOSIS — J309 Allergic rhinitis, unspecified: Secondary | ICD-10-CM

## 2011-10-21 LAB — POCT URINALYSIS DIPSTICK
Glucose, UA: NEGATIVE
Ketones, UA: NEGATIVE
Leukocytes, UA: NEGATIVE
Spec Grav, UA: 1.015
Urobilinogen, UA: 1

## 2011-10-21 LAB — CBC WITH DIFFERENTIAL/PLATELET
Basophils Relative: 0.4 % (ref 0.0–3.0)
Eosinophils Absolute: 0.1 10*3/uL (ref 0.0–0.7)
Eosinophils Relative: 1.1 % (ref 0.0–5.0)
Lymphocytes Relative: 32.7 % (ref 12.0–46.0)
Neutrophils Relative %: 58.1 % (ref 43.0–77.0)
RBC: 5.01 Mil/uL (ref 4.22–5.81)
WBC: 5.3 10*3/uL (ref 4.5–10.5)

## 2011-10-21 LAB — BASIC METABOLIC PANEL
CO2: 26 mEq/L (ref 19–32)
Calcium: 9.3 mg/dL (ref 8.4–10.5)
GFR: 111.8 mL/min (ref >60.00)
Sodium: 139 mEq/L (ref 135–145)

## 2011-10-21 LAB — LIPID PANEL
Cholesterol: 143 mg/dL (ref 0–200)
HDL: 33.3 mg/dL — ABNORMAL LOW (ref >39.00)
LDL Cholesterol: 98 mg/dL (ref 0–99)
Triglycerides: 61 mg/dL (ref 0.0–149.0)
VLDL: 12.2 mg/dL (ref 0.0–40.0)

## 2011-10-21 LAB — HEPATIC FUNCTION PANEL
Albumin: 4.1 g/dL (ref 3.5–5.2)
Total Protein: 7.4 g/dL (ref 6.0–8.3)

## 2011-10-21 LAB — TSH: TSH: 1.28 u[IU]/mL (ref 0.35–5.50)

## 2011-10-21 MED ORDER — MOMETASONE FUROATE 50 MCG/ACT NA SUSP: 1.0000 | Freq: Every day | NASAL | Status: DC

## 2011-10-21 MED ORDER — VARDENAFIL HCL 20 MG PO TABS: 20.0000 mg | ORAL_TABLET | Freq: Every day | ORAL | Status: AC | PRN

## 2011-10-21 NOTE — Progress Notes (Signed)
  Subjective:    Patient ID: Jeremy Cardenas, male    DOB: 1946-06-12, 66 y.o.   MRN: [id number]  HPI Jeremy Cardenas is a 66 year old married male nonsmoker who comes in today for a Medicare wellness examination  Has a history of underlying allergic rhinitis for which she takes over-the-counter plain Claritin and steroid nasal spray  He has a history of erectile dysfunction we tried Viagra however gave him side effects. We'll switch to Levitra  He gets routine eye care, hearing normal, regular dental care, colonoscopy and GI, tetanus 2010, Pneumovax 2011, seasonal flu shot 2012, information given on shingles  Cognitive function normal he walks on a regular basis home health safety reviewed no issues identified, no guns in the house, he does have a health care power of attorney and living will.   Review of Systems  Constitutional: Negative.   HENT: Negative.   Eyes: Negative.   Respiratory: Negative.   Cardiovascular: Negative.   Gastrointestinal: Negative.   Genitourinary: Negative.   Musculoskeletal: Negative.   Skin: Negative.   Neurological: Negative.   Hematological: Negative.   Psychiatric/Behavioral: Negative.        Objective:   Physical Exam  Constitutional: He is oriented to person, place, and time. He appears well-developed and well-nourished. No distress.  HENT:  Head: Normocephalic and atraumatic.  Right Ear: External ear normal.  Left Ear: External ear normal.  Nose: Nose normal.  Mouth/Throat: Oropharynx is clear and moist. No oropharyngeal exudate.  Eyes: Conjunctivae and EOM are normal. Pupils are equal, round, and reactive to light. Right eye exhibits no discharge. Left eye exhibits no discharge. No scleral icterus.  Neck: Normal range of motion. Neck supple. No JVD present. No tracheal deviation present. No thyromegaly present.  Cardiovascular: Normal rate, regular rhythm, normal heart sounds and intact distal pulses.  Exam reveals no gallop and no friction rub.     No murmur heard. Pulmonary/Chest: Effort normal and breath sounds normal. No stridor. No respiratory distress. He has no wheezes. He has no rales. He exhibits no tenderness.  Abdominal: Soft. Bowel sounds are normal. He exhibits no distension and no mass. There is no tenderness. There is no rebound and no guarding.  Genitourinary: Rectum normal, prostate normal and penis normal. Guaiac negative stool. No penile tenderness.  Musculoskeletal: Normal range of motion. He exhibits no edema and no tenderness.  Lymphadenopathy:    He has no cervical adenopathy.  Neurological: He is alert and oriented to person, place, and time. He has normal reflexes. He displays normal reflexes. No cranial nerve deficit. He exhibits normal muscle tone. Coordination normal.  Skin: Skin is warm and dry. No rash noted. He is not diaphoretic. No erythema. No pallor.  Psychiatric: He has a normal mood and affect. His behavior is normal. Judgment and thought content normal.          Assessment & Plan:  Healthy male  Allergic rhinitis continue over-the-counter antihistamines and steroid nasal spray  Erectile dysfunction trial of Levitra urology consult when necessary  History of colon polyps followup in GI  Hearing loss refer to audiology  Recommended shingles vaccine

## 2011-10-21 NOTE — Patient Instructions (Signed)
Try Levitra it's a 20 mg tablet,,,,,,,,,, one half tab 1-2 hours prior to sex  If this medication does not work or you have side effects then I would recommend a urology consult  Get a health care power of attorney  Followup in 1 year sooner if any problems  Consider a hearing aid evaluation

## 2011-10-23 ENCOUNTER — Telehealth: Payer: Self-pay | Admitting: *Deleted

## 2011-10-23 DIAGNOSIS — N529 Male erectile dysfunction, unspecified: Secondary | ICD-10-CM

## 2011-10-23 DIAGNOSIS — J309 Allergic rhinitis, unspecified: Secondary | ICD-10-CM

## 2011-10-23 MED ORDER — MOMETASONE FUROATE 50 MCG/ACT NA SUSP: 2.0000 | Freq: Every day | NASAL | Status: DC

## 2011-10-23 NOTE — Telephone Encounter (Signed)
rx clarification

## 2011-12-09 ENCOUNTER — Telehealth: Payer: Self-pay | Admitting: Family Medicine

## 2011-12-09 NOTE — Telephone Encounter (Signed)
ok 

## 2011-12-09 NOTE — Telephone Encounter (Signed)
Pt would like blood work results also copy of results mail to [employer].

## 2011-12-10 NOTE — Telephone Encounter (Signed)
Left message on machine for patient and copy mailed to home address

## 2013-01-12 ENCOUNTER — Encounter: Payer: Self-pay | Admitting: Pulmonary Disease

## 2013-01-12 ENCOUNTER — Ambulatory Visit (INDEPENDENT_AMBULATORY_CARE_PROVIDER_SITE_OTHER): Payer: Medicare Other | Admitting: Pulmonary Disease

## 2013-01-12 VITALS — BP 122/60 | HR 65 | Temp 98.1°F | Ht 69.0 in | Wt 201.0 lb

## 2013-01-12 DIAGNOSIS — G4733 Obstructive sleep apnea (adult) (pediatric): Secondary | ICD-10-CM

## 2013-01-12 NOTE — Progress Notes (Signed)
  Subjective:    Patient ID: Jeremy Cardenas, male    DOB: 12/15/1945, 67 y.o.   MRN: [id number]  HPI  64/M for F/U for OSA. Well maintained on CPAP 8 cm H2O, nasal pillows with chin strap. Compliant with CPAP. No daytime hypersomnolence. Last seen by DS in 1/09.  PSg was performed in 2001.5-10 lb wt gain since then. Epworth Sleepiness Score 6  Uses flonase at bedtime.  Reviewed PSG 5/01 >> baseline RDI 52/h corrected by CPAP 7 cm 9/01    Pneumovax 2011   01/12/2013 last seen 07/02/2011. Pt states he is still on CPAP and wears it everynight. he is wanting to change DME to [facility] ( spouse uses this DME). Pt is wanting to see if he can get a new machine he has had his over 5 year.   Wt 201 lbs  feels well, mask ok, pressure ok, wt unchanged, denies excessive daytime somnolence, snoring  nasonex working ok. Has not needed new supplies - wonders if he should change supplier to [facility] from [facility] Needs CPAP with adaptor when he visits [place] Wt stable Review of Systems  neg for any significant sore throat, dysphagia, itching, sneezing, nasal congestion or excess/ purulent secretions, fever, chills, sweats, unintended wt loss, pleuritic or exertional cp, hempoptysis, orthopnea pnd or change in chronic leg swelling. Also denies presyncope, palpitations, heartburn, abdominal pain, nausea, vomiting, diarrhea or change in bowel or urinary habits, dysuria,hematuria, rash, arthralgias, visual complaints, headache, numbness weakness or ataxia.     Objective:   Physical Exam  Gen. Pleasant, well-nourished, in no distress ENT - no lesions, no post nasal drip Neck: No JVD, no thyromegaly, no carotid bruits Lungs: no use of accessory muscles, no dullness to percussion, clear without rales or rhonchi  Cardiovascular: Rhythm regular, heart sounds  normal, no murmurs or gallops, no peripheral edema Musculoskeletal: No deformities, no cyanosis or clubbing        Assessment & Plan:

## 2013-01-12 NOTE — Patient Instructions (Signed)
We will send in Rx for new CPAP to [facility]

## 2013-01-12 NOTE — Assessment & Plan Note (Signed)
PSG 5/01 >> RDI 52/h corrected by CPAP 7 cm 9/01  Maintained on 8 cm  Weight loss encouraged, compliance with goal of at least 4-6 hrs every night is the expectation. Advised against medications with sedative side effects Cautioned against driving when sleepy - understanding that sleepiness will vary on a day to day basis  Rx will be sent & supplies renewed

## 2013-02-18 ENCOUNTER — Telehealth: Payer: Self-pay | Admitting: Pulmonary Disease

## 2013-02-18 NOTE — Telephone Encounter (Signed)
I have faxed this over to [facility]. She is aware. Nothing further was needed

## 2013-02-24 ENCOUNTER — Telehealth: Payer: Self-pay | Admitting: Pulmonary Disease

## 2013-02-24 NOTE — Telephone Encounter (Signed)
I called sharon. This form was not received. She will fax this to triage. Once received will have RA sign this and fax back. Nothing further was needed

## 2013-03-01 ENCOUNTER — Telehealth: Payer: Self-pay | Admitting: Pulmonary Disease

## 2013-03-01 NOTE — Telephone Encounter (Signed)
I advised Jeremy Cardenas we did receive form. I advised RA is not in until tomorrow and will get him to sign this ASAP and fax ASAP as well. Nothing further was needed

## 2013-04-15 ENCOUNTER — Encounter: Payer: Medicare Other | Admitting: Family Medicine

## 2013-05-25 ENCOUNTER — Encounter: Payer: Self-pay | Admitting: Family Medicine

## 2013-05-25 ENCOUNTER — Ambulatory Visit (INDEPENDENT_AMBULATORY_CARE_PROVIDER_SITE_OTHER): Payer: Medicare Other | Admitting: Family Medicine

## 2013-05-25 VITALS — BP 120/74 | Temp 97.7°F | Ht 69.0 in | Wt 202.0 lb

## 2013-05-25 DIAGNOSIS — Z0000 Encounter for general adult medical examination without abnormal findings: Secondary | ICD-10-CM

## 2013-05-25 DIAGNOSIS — N401 Enlarged prostate with lower urinary tract symptoms: Secondary | ICD-10-CM

## 2013-05-25 DIAGNOSIS — Z23 Encounter for immunization: Secondary | ICD-10-CM

## 2013-05-25 DIAGNOSIS — N138 Other obstructive and reflux uropathy: Secondary | ICD-10-CM

## 2013-05-25 DIAGNOSIS — N529 Male erectile dysfunction, unspecified: Secondary | ICD-10-CM

## 2013-05-25 LAB — CBC WITH DIFFERENTIAL/PLATELET
Basophils Absolute: 0 10*3/uL (ref 0.0–0.1)
Eosinophils Absolute: 0.2 10*3/uL (ref 0.0–0.7)
Lymphocytes Relative: 31 % (ref 12.0–46.0)
MCHC: 34 g/dL (ref 30.0–36.0)
Monocytes Relative: 7.7 % (ref 3.0–12.0)
Neutrophils Relative %: 57.8 % (ref 43.0–77.0)
Platelets: 180 10*3/uL (ref 150.0–400.0)
RDW: 14.2 % (ref 11.5–14.6)

## 2013-05-25 LAB — POCT URINALYSIS DIPSTICK
Bilirubin, UA: NEGATIVE
Glucose, UA: NEGATIVE
Leukocytes, UA: NEGATIVE
Nitrite, UA: NEGATIVE
Urobilinogen, UA: 0.2

## 2013-05-25 LAB — HEPATIC FUNCTION PANEL
ALT: 29 U/L (ref 0–53)
AST: 24 U/L (ref 0–37)
Albumin: 4.2 g/dL (ref 3.5–5.2)
Total Protein: 7.3 g/dL (ref 6.0–8.3)

## 2013-05-25 LAB — LIPID PANEL
Cholesterol: 174 mg/dL (ref 0–200)
HDL: 40.2 mg/dL (ref >39.00)
Total CHOL/HDL Ratio: 4
Triglycerides: 114 mg/dL (ref 0.0–149.0)

## 2013-05-25 LAB — BASIC METABOLIC PANEL
BUN: 18 mg/dL (ref 6–23)
Calcium: 9.3 mg/dL (ref 8.4–10.5)
GFR: 101.7 mL/min (ref >60.00)
Glucose, Bld: 91 mg/dL (ref 70–99)
Potassium: 4.7 mEq/L (ref 3.5–5.1)
Sodium: 140 mEq/L (ref 135–145)

## 2013-05-25 LAB — PSA: PSA: 1.6 ng/mL (ref 0.10–4.00)

## 2013-05-25 MED ORDER — MOMETASONE FUROATE 50 MCG/ACT NA SUSP: 2.0000 | Freq: Every day | NASAL | Status: DC | PRN

## 2013-05-25 MED ORDER — TADALAFIL 5 MG PO TABS: 5.0000 mg | ORAL_TABLET | Freq: Every day | ORAL | Status: DC | PRN

## 2013-05-25 NOTE — Patient Instructions (Signed)
Continue your current medications........ nasal spray when necessary  Take an aspirin tablet daily  Return in one year for general medical exam sooner if any problems  Call and find out where you can get the shingles vaccine the cheapest

## 2013-05-25 NOTE — Progress Notes (Signed)
  Subjective:    Patient ID: Jeremy Cardenas, male    DOB: 05-08-1946, 66 y.o.   MRN: [id number]  HPI [name] is a 68 year old married male nonsmoker who comes in today for a Medicare wellness examination  He takes a steroid nasal spray when necessary for allergic rhinitis otherwise no medication  He gets routine eye care, dental care, colonoscopy recently normal, vaccinations up-to-date  Review of systems negative except for some soreness his right knee no history of trauma. He took 600 mg of Motrin twice a day but it caused GI upset. He stopped the Motrin temporarily then went back and took 400 mg the morning 200 mg evening and now the soreness in his knee seems to have dissipated. Again no swelling locking or trauma.  Cognitive function normal he walks on a regular basis home health safety reviewed no issues identified, no guns in the house, he does have a health care power of attorney and living will    Review of Systems  Constitutional: Negative.   HENT: Negative.   Eyes: Negative.   Respiratory: Negative.   Cardiovascular: Negative.   Gastrointestinal: Negative.   Endocrine: Negative.   Genitourinary: Negative.   Musculoskeletal: Negative.   Skin: Negative.   Allergic/Immunologic: Negative.   Neurological: Negative.   Hematological: Negative.   Psychiatric/Behavioral: Negative.        Objective:   Physical Exam  Constitutional: He is oriented to person, place, and time. He appears well-developed and well-nourished.  HENT:  Head: Normocephalic and atraumatic.  Right Ear: External ear normal.  Left Ear: External ear normal.  Nose: Nose normal.  Mouth/Throat: Oropharynx is clear and moist.  Eyes: Conjunctivae and EOM are normal. Pupils are equal, round, and reactive to light.  Neck: Normal range of motion. Neck supple. No JVD present. No tracheal deviation present. No thyromegaly present.  Cardiovascular: Normal rate, regular rhythm, normal heart sounds and intact  distal pulses.  Exam reveals no gallop and no friction rub.   No murmur heard. No carotid or aortic bruits peripheral pulses 2+ and symmetrical  Pulmonary/Chest: Effort normal and breath sounds normal. No stridor. No respiratory distress. He has no wheezes. He has no rales. He exhibits no tenderness.  Abdominal: Soft. Bowel sounds are normal. He exhibits no distension and no mass. There is no tenderness. There is no rebound and no guarding.  Genitourinary: Rectum normal, prostate normal and penis normal. Guaiac negative stool. No penile tenderness.  Musculoskeletal: Normal range of motion. He exhibits no edema and no tenderness.  Lymphadenopathy:    He has no cervical adenopathy.  Neurological: He is alert and oriented to person, place, and time. He has normal reflexes. No cranial nerve deficit. He exhibits normal muscle tone.  Skin: Skin is warm and dry. No rash noted. No erythema. No pallor.  Total body skin exam normal,,,,,, after the lead series: To the beach and fish  Psychiatric: He has a normal mood and affect. His behavior is normal. Judgment and thought content normal.          Assessment & Plan:  Healthy male  Allergic rhinitis continue steroid nasal spray

## 2013-11-08 ENCOUNTER — Ambulatory Visit (INDEPENDENT_AMBULATORY_CARE_PROVIDER_SITE_OTHER): Payer: Medicare HMO | Admitting: Podiatry

## 2013-11-08 ENCOUNTER — Ambulatory Visit (INDEPENDENT_AMBULATORY_CARE_PROVIDER_SITE_OTHER): Payer: Medicare HMO

## 2013-11-08 ENCOUNTER — Encounter: Payer: Self-pay | Admitting: Podiatry

## 2013-11-08 VITALS — BP 111/71 | HR 64 | Resp 18

## 2013-11-08 DIAGNOSIS — M722 Plantar fascial fibromatosis: Secondary | ICD-10-CM

## 2013-11-08 MED ORDER — TRIAMCINOLONE ACETONIDE 10 MG/ML IJ SUSP
10.0000 mg | Freq: Once | INTRAMUSCULAR | Status: AC
  Administered 2013-11-08: 10 mg

## 2013-11-08 MED ORDER — MELOXICAM 15 MG PO TABS: 15.0000 mg | ORAL_TABLET | Freq: Every day | ORAL | Status: DC

## 2013-11-08 NOTE — Progress Notes (Signed)
Subjective:     Patient ID: Jeremy Cardenas, male   DOB: 02-25-1946, 68 y.o.   MRN: [id number]  HPI patient presents stating my heel is really killing me in the present for about 6 weeks. I've tried some anti-inflammatories and over-the-counter insoles which have helped me some   Review of Systems  All other systems reviewed and are negative.      Objective:   Physical Exam  Nursing note and vitals reviewed. Constitutional: He is oriented to person, place, and time.  Cardiovascular: Intact distal pulses.   Musculoskeletal: Normal range of motion.  Neurological: He is oriented to person, place, and time.  Skin: Skin is warm.   neurovascular status is found to be intact with patient well oriented and found to have good muscle strength and adequate range of motion of the subtalar midtarsal joint. I noted there to be discomfort in the plantar right heel at the insertion of the tendon into the calcaneus. Fill time to the digits was within normal limits and mild equinus condition was noted     Assessment:     Plantar fasciitis of the heel right with inflammation and fluid buildup    Plan:     H&P and x-ray reviewed and injected the right plantar fascia 3 mg Kenalog 5 mg Xylocaine Marcaine mixture and applied fascially brace with instructions on usage. Reappoint to recheck again in one week

## 2013-11-08 NOTE — Progress Notes (Signed)
° °  Subjective:    Patient ID: Jeremy Cardenas, male    DOB: 1946/03/06, 68 y.o.   MRN: [id number]  HPI my right heel has been hurting me for about 6 weeks and throbbing and it does have sharp pain and I am taken ibuprofen and got some insoles and it hurts on the bottom of my heel and then run up to the ball of my foot    Review of Systems  All other systems reviewed and are negative.      Objective:   Physical Exam        Assessment & Plan:

## 2013-11-08 NOTE — Patient Instructions (Signed)

## 2013-11-15 ENCOUNTER — Ambulatory Visit (INDEPENDENT_AMBULATORY_CARE_PROVIDER_SITE_OTHER): Payer: Medicare HMO | Admitting: Podiatry

## 2013-11-15 ENCOUNTER — Encounter: Payer: Self-pay | Admitting: Podiatry

## 2013-11-15 ENCOUNTER — Ambulatory Visit: Payer: Medicare HMO | Admitting: Podiatry

## 2013-11-15 VITALS — BP 120/68 | HR 63 | Resp 16

## 2013-11-15 DIAGNOSIS — M722 Plantar fascial fibromatosis: Secondary | ICD-10-CM

## 2013-11-15 MED ORDER — TRIAMCINOLONE ACETONIDE 10 MG/ML IJ SUSP
10.0000 mg | Freq: Once | INTRAMUSCULAR | Status: AC
  Administered 2013-11-15: 10 mg

## 2013-11-15 NOTE — Progress Notes (Signed)
Subjective:     Patient ID: Jeremy Cardenas, male   DOB: August 22, 1945, 68 y.o.   MRN: [id number]  HPI patient states that it is some better but still quite painful in the plantar heel when I do a lot of walking   Review of Systems     Objective:   Physical Exam Neurovascular status intact with continued discomfort plantar heel right at the insertional point of the tendon into the calcaneus    Assessment:     Plantar fasciitis of the right heel with inflammation and fluid at the insertion    Plan:     Reinjected the plantar fascia 3 mm Kenalog 5 mg I can Marcaine mixture and discussed orthotics which we'll review in 3 weeks

## 2013-12-06 ENCOUNTER — Ambulatory Visit (INDEPENDENT_AMBULATORY_CARE_PROVIDER_SITE_OTHER): Payer: Medicare HMO | Admitting: Podiatry

## 2013-12-06 ENCOUNTER — Encounter: Payer: Self-pay | Admitting: Podiatry

## 2013-12-06 VITALS — BP 145/73 | HR 61 | Resp 16

## 2013-12-06 DIAGNOSIS — M722 Plantar fascial fibromatosis: Secondary | ICD-10-CM

## 2013-12-06 NOTE — Progress Notes (Signed)
Subjective:     Patient ID: Jeremy Cardenas, male   DOB: January 28, 1946, 68 y.o.   MRN: [id number]  HPI patient states my heel is feeling pretty good with minimal discomfort at the current time   Review of Systems     Objective:   Physical Exam Neurovascular status intact with diminishment of discomfort in the right plantar fascia and insertion with mild inflammation upon prolonged weightbearing    Assessment:     Plantar fasciitis right which is improving at the current time    Plan:     Advised on the continuation of physical therapy and anti-inflammatories and structured shoes. Reappoint for [place] to recheck again in the next 6 weeks as needed

## 2013-12-13 ENCOUNTER — Ambulatory Visit (INDEPENDENT_AMBULATORY_CARE_PROVIDER_SITE_OTHER): Payer: Medicare HMO | Admitting: Family Medicine

## 2013-12-13 ENCOUNTER — Encounter: Payer: Self-pay | Admitting: Family Medicine

## 2013-12-13 VITALS — BP 120/80 | Temp 98.4°F | Wt 200.0 lb

## 2013-12-13 DIAGNOSIS — T148 Other injury of unspecified body region: Secondary | ICD-10-CM

## 2013-12-13 DIAGNOSIS — W57XXXA Bitten or stung by nonvenomous insect and other nonvenomous arthropods, initial encounter: Secondary | ICD-10-CM

## 2013-12-13 NOTE — Patient Instructions (Signed)
Look for symptoms consistent with Lyme's disease and/or [facility] spotted fever. If you have any questions call immediately  Return when necessary

## 2013-12-13 NOTE — Progress Notes (Signed)
Pre visit review using our clinic review tool, if applicable. No additional management support is needed unless otherwise documented below in the visit note. 

## 2013-12-13 NOTE — Progress Notes (Signed)
   Subjective:    Patient ID: Jeremy Cardenas, male    DOB: 1946-02-05, 68 y.o.   MRN: [id number]  HPI Jeremy Cardenas is a 68 year old married male nonsmoker who comes in today for evaluation of a tech bite  A week ago he noticed a tick on his back his wife pulled off. He's had no fever chills sore throat headache or skin rash. He thinks it was a deer tick because it was small it was on his back below the right scapula   Review of Systems Negative    Objective:   Physical Exam  Well-developed well nourished male no acute distress vital signs stable is afebrile examination of back shows a red lesion with a central dark area consistent with a retained product of the tech.  There was cleaned with alcohol the tick part was removed.      Assessment & Plan:  Tick bite observe for [facility] spotted fever and Lyme's disease

## 2014-05-13 ENCOUNTER — Other Ambulatory Visit: Payer: Self-pay

## 2014-06-30 ENCOUNTER — Encounter: Payer: Self-pay | Admitting: Pulmonary Disease

## 2014-06-30 ENCOUNTER — Ambulatory Visit (INDEPENDENT_AMBULATORY_CARE_PROVIDER_SITE_OTHER): Payer: Medicare HMO | Admitting: Pulmonary Disease

## 2014-06-30 VITALS — BP 110/70 | HR 71 | Temp 98.1°F | Ht 69.0 in | Wt 207.0 lb

## 2014-06-30 DIAGNOSIS — G4733 Obstructive sleep apnea (adult) (pediatric): Secondary | ICD-10-CM

## 2014-06-30 NOTE — Progress Notes (Signed)
   Subjective:    Patient ID: Jeremy Cardenas, male    DOB: 07-20-1946, 68 y.o.   MRN: [id number]  HPI  68/M for F/U for OSA. Well maintained on CPAP 8 cm H2O, nasal pillows with chin strap.  Uses flonase at bedtime.  PSG 11/1999 >> baseline RDI 52/h corrected by CPAP 7 cm 9/01    06/30/2014  Chief Complaint  Patient presents with  . Follow-up    F/U OSA; last download was 10/28/2013; wears CPAP every night approx 8 hours nightly, 8 cm   Annual FU Download 10/2013 on 8 cm >> good usage, residual AHI 4.7/h, leak ok  Pt states hewears it everynight.  Wt 206 lbs-gained 5  feels well, mask ok, pressure ok, wt unchanged, denies excessive daytime somnolence, snoring  nasonex working ok.  needs new supplies -    Review of Systems neg for any significant sore throat, dysphagia, itching, sneezing, nasal congestion or excess/ purulent secretions, fever, chills, sweats, unintended wt loss, pleuritic or exertional cp, hempoptysis, orthopnea pnd or change in chronic leg swelling. Also denies presyncope, palpitations, heartburn, abdominal pain, nausea, vomiting, diarrhea or change in bowel or urinary habits, dysuria,hematuria, rash, arthralgias, visual complaints, headache, numbness weakness or ataxia.     Objective:   Physical Exam  Gen. Pleasant, well-nourished, in no distress ENT - no lesions, no post nasal drip Neck: No JVD, no thyromegaly, no carotid bruits Lungs: no use of accessory muscles, no dullness to percussion, clear without rales or rhonchi  Cardiovascular: Rhythm regular, heart sounds  normal, no murmurs or gallops, no peripheral edema Musculoskeletal: No deformities, no cyanosis or clubbing        Assessment & Plan:

## 2014-06-30 NOTE — Assessment & Plan Note (Signed)
Weight loss encouraged, compliance with goal of at least 4-6 hrs every night is the expectation. Advised against medications with sedative side effects Cautioned against driving when sleepy - understanding that sleepiness will vary on a day to day basis  Ct cpap 8cm, supplies renewed

## 2014-06-30 NOTE — Patient Instructions (Signed)
CPAP supplies will be renewed x 1 year CPAP is set at 8 cm

## 2015-01-23 ENCOUNTER — Other Ambulatory Visit: Payer: Self-pay

## 2015-06-07 ENCOUNTER — Telehealth: Payer: Self-pay | Admitting: Pulmonary Disease

## 2015-06-07 DIAGNOSIS — G4733 Obstructive sleep apnea (adult) (pediatric): Secondary | ICD-10-CM

## 2015-06-07 NOTE — Telephone Encounter (Signed)
DME: [facility] W[facility] to change from [facility] - Patient scheduled for 1 year follow up with Rexene Edison, NP on 07/06/15.  Patient needs chin strap ordered prior to appointment, needs order to go to [facility]  ([facility] no longer takes their insurance.)  Order entered for [employer].  Advised patient that he needs to be seen in order to get supplies updated x 1 year.  Patient verbalized understanding. Nothing further needed.  Closing encounter

## 2015-06-12 DIAGNOSIS — T1511XA Foreign body in conjunctival sac, right eye, initial encounter: Secondary | ICD-10-CM | POA: Diagnosis not present

## 2015-06-26 ENCOUNTER — Encounter: Payer: Self-pay | Admitting: Internal Medicine

## 2015-06-27 ENCOUNTER — Encounter: Payer: Self-pay | Admitting: Internal Medicine

## 2015-07-06 ENCOUNTER — Encounter: Payer: Self-pay | Admitting: Adult Health

## 2015-07-06 ENCOUNTER — Ambulatory Visit (INDEPENDENT_AMBULATORY_CARE_PROVIDER_SITE_OTHER): Payer: Medicare HMO | Admitting: Adult Health

## 2015-07-06 VITALS — BP 116/74 | HR 67 | Temp 97.6°F | Ht 69.0 in | Wt 208.0 lb

## 2015-07-06 DIAGNOSIS — G4733 Obstructive sleep apnea (adult) (pediatric): Secondary | ICD-10-CM | POA: Diagnosis not present

## 2015-07-06 NOTE — Patient Instructions (Addendum)
Continue on CPAP at bedtime.  Do not drive if sleepy  Remain active , work on weight loss. Order for download.  Order to change to [facility] .  follow up Dr. Elsworth Soho  In 1 year and As needed

## 2015-07-06 NOTE — Assessment & Plan Note (Signed)
Controlled on CPAP  Plan  Continue on CPAP at bedtime.  Do not drive if sleepy  Remain active , work on weight loss. Order for download.  Order to change to [facility] .  follow up Dr. Elsworth Soho  In 1 year and As needed

## 2015-07-06 NOTE — Progress Notes (Signed)
   Subjective:    Patient ID: Jeremy Cardenas, male    DOB: 02/09/1946, 69 y.o.   MRN: [id number]  HPI 69 year old male with obstructive sleep apnea.  Test PSG May 2001> baseline RDI 52/h corrected by CPAP 7 cm 9/01    07/06/2015 Follow up : OSA  Patient returns for one-year follow-up for sleep apnea. Says overall he is doing well on his C Pap. He does need a SD card for his C Pap in order to do a download. Wears all night  For 7 hr.  Needs  order for new chin strap. Is that he feels rested. He denies any significant daytime sleepiness. Patient denies any chest pain, orthopnea, PND or leg swelling. Would like to change from [facility] to [facility].    Past Medical History  Diagnosis Date  . Hypothyroidism   . Hyperlipidemia   . Sleep apnea   . Hearing loss    No current outpatient prescriptions on file prior to visit.   No current facility-administered medications on file prior to visit.     Review of Systems Constitutional:   No  weight loss, night sweats,  Fevers, chills, fatigue, or  lassitude.  HEENT:   No headaches,  Difficulty swallowing,  Tooth/dental problems, or  Sore throat,                No sneezing, itching, ear ache, nasal congestion, post nasal drip,   CV:  No chest pain,  Orthopnea, PND, swelling in lower extremities, anasarca, dizziness, palpitations, syncope.   GI  No heartburn, indigestion, abdominal pain, nausea, vomiting, diarrhea, change in bowel habits, loss of appetite, bloody stools.   Resp: No shortness of breath with exertion or at rest.  No excess mucus, no productive cough,  No non-productive cough,  No coughing up of blood.  No change in color of mucus.  No wheezing.  No chest wall deformity  Skin: no rash or lesions.  GU: no dysuria, change in color of urine, no urgency or frequency.  No flank pain, no hematuria   MS:  No joint pain or swelling.  No decreased range of motion.  No back pain.  Psych:  No change in mood or affect. No  depression or anxiety.  No memory loss.         Objective:   Physical Exam GEN: A/Ox3; pleasant , NAD, overweight   HEENT:  [place]/AT,  EACs-clear, TMs-wnl, NOSE-clear, THROAT-clear, no lesions, no postnasal drip or exudate noted. Class 2-3 MP airway   NECK:  Supple w/ fair ROM; no JVD; normal carotid impulses w/o bruits; no thyromegaly or nodules palpated; no lymphadenopathy.  RESP  Clear  P & A; w/o, wheezes/ rales/ or rhonchi.no accessory muscle use, no dullness to percussion  CARD:  RRR, no m/r/g  , no peripheral edema, pulses intact, no cyanosis or clubbing.  GI:   Soft & nt; nml bowel sounds; no organomegaly or masses detected.  Musco: Warm bil, no deformities or joint swelling noted.   Neuro: alert, no focal deficits noted.    Skin: Warm, no lesions or rashes         Assessment & Plan:

## 2015-07-06 NOTE — Addendum Note (Signed)
Addended by: Mathis Dad on: 07/06/2015 10:51 AM   Modules accepted: Orders

## 2015-07-07 NOTE — Progress Notes (Signed)
Reviewed & agree with plan  

## 2015-07-14 DIAGNOSIS — H25012 Cortical age-related cataract, left eye: Secondary | ICD-10-CM | POA: Diagnosis not present

## 2015-07-14 DIAGNOSIS — H2513 Age-related nuclear cataract, bilateral: Secondary | ICD-10-CM | POA: Diagnosis not present

## 2015-07-14 DIAGNOSIS — H43813 Vitreous degeneration, bilateral: Secondary | ICD-10-CM | POA: Diagnosis not present

## 2015-07-21 ENCOUNTER — Telehealth: Payer: Self-pay | Admitting: Adult Health

## 2015-07-21 NOTE — Telephone Encounter (Signed)
Per TP after reviewing download from 04/15/2015 - 07/03/2015  Excellent Compliance  Questionable mask leaks Control not as good Make sure mask and chin strap have been changed.   LVM for patient to return call.

## 2015-07-21 NOTE — Telephone Encounter (Signed)
Pt is aware of download results. Nothing further was needed. 

## 2015-07-28 ENCOUNTER — Encounter: Payer: Self-pay | Admitting: Internal Medicine

## 2015-08-22 DIAGNOSIS — R69 Illness, unspecified: Secondary | ICD-10-CM | POA: Diagnosis not present

## 2015-08-24 ENCOUNTER — Encounter: Payer: Self-pay | Admitting: Adult Health

## 2015-09-26 ENCOUNTER — Encounter: Payer: Self-pay | Admitting: Internal Medicine

## 2015-09-26 ENCOUNTER — Ambulatory Visit (INDEPENDENT_AMBULATORY_CARE_PROVIDER_SITE_OTHER): Payer: Medicare HMO | Admitting: Internal Medicine

## 2015-09-26 VITALS — BP 100/70 | HR 68 | Ht 69.0 in | Wt 209.5 lb

## 2015-09-26 DIAGNOSIS — K9049 Malabsorption due to intolerance, not elsewhere classified: Secondary | ICD-10-CM | POA: Diagnosis not present

## 2015-09-26 DIAGNOSIS — Z1211 Encounter for screening for malignant neoplasm of colon: Secondary | ICD-10-CM | POA: Diagnosis not present

## 2015-09-26 NOTE — Progress Notes (Signed)
   Subjective:    Patient ID: Jeremy Cardenas, male    DOB: [birth year], 70 y.o.   MRN: [id number] Cc: colonoscopy? HPI  The patient is here to discuss possible repeat colonoscopy. 3 polyps destroyed in 2000, no polyps since, last colonoscopy 2009. In past had q 5 yrs as ? Mother with "intestinal cancer" - she was 46  Has gas with beans - despite using Beano - only with those foods. Has a brother with celiac disease sxs different - patient not interested in testing  Medications, allergies, past medical history, past surgical history, family history and social history are reviewed and updated in the EMR.  Review of Systems As above    Objective:   Physical Exam BP 100/70 mmHg  Pulse 68  Ht 5\' 9"  (1.753 m)  Wt 209 lb 8 oz (95.029 kg)  BMI 30.92 kg/m2     Assessment & Plan:  Food intolerance in adult [facility]) - beans  Colon cancer screening   He will try higher doses of Beano  Next routine colonoscopy 2019 - not really at increased risk as previously thought

## 2015-09-26 NOTE — Patient Instructions (Signed)
   Try taking some extra Beano to see if that works.  We will plan on a routine colonoscopy in late 2019.  I appreciate the opportunity to care for you. Gatha Mayer, MD, [facility]

## 2016-02-08 DIAGNOSIS — G4733 Obstructive sleep apnea (adult) (pediatric): Secondary | ICD-10-CM | POA: Diagnosis not present

## 2016-03-04 ENCOUNTER — Telehealth: Payer: Self-pay | Admitting: Pulmonary Disease

## 2016-03-04 DIAGNOSIS — G4733 Obstructive sleep apnea (adult) (pediatric): Secondary | ICD-10-CM

## 2016-03-04 NOTE — Telephone Encounter (Signed)
Attempted to contact patient, left message for patient to return call.

## 2016-03-04 NOTE — Telephone Encounter (Signed)
[place] returned Elise's call, [phone].
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
## 2016-03-04 NOTE — Telephone Encounter (Signed)
LMTCB for Jeremy Cardenas  

## 2016-03-04 NOTE — Telephone Encounter (Signed)
OK CPAP 7 cm

## 2016-03-05 NOTE — Telephone Encounter (Signed)
Pt called back and is aware of what Melissa stated.  He stated that he had already spoke to his insurance company and they stated that they will cover 80% of the machine and he will be responsible for 20%.  Order has been placed for the cpap and supplies for the pt and Melissa is aware. Nothing further is needed.

## 2016-03-05 NOTE — Telephone Encounter (Signed)
Called and spoke with Melissa from [facility]--- She stated that the pt was only getting supplies from [facility] and got his cpap machine from [facility] in  02-2013, so he is not due for a new machine or replacemtn--he will need to go through his home owners to get that replaced, or call his health insurance to see if they will help him get a replacement unit.  I have called and lmomtcb for pt to make him aware of what Melissa found out for him.

## 2016-03-08 ENCOUNTER — Telehealth: Payer: Self-pay | Admitting: Pulmonary Disease

## 2016-03-08 NOTE — Telephone Encounter (Signed)
Order has been sent to [facility].  [place] - will [facility] do the authorization or does our office do that?  Please advise.

## 2016-03-11 NOTE — Telephone Encounter (Signed)
[facility] please advise. thanks

## 2016-03-11 NOTE — Telephone Encounter (Signed)
The dme does the auth for the machine and equiptment and they will let the pt now the cost

## 2016-03-19 ENCOUNTER — Telehealth: Payer: Self-pay | Admitting: Pulmonary Disease

## 2016-03-20 DIAGNOSIS — R69 Illness, unspecified: Secondary | ICD-10-CM | POA: Diagnosis not present

## 2016-03-20 DIAGNOSIS — G4733 Obstructive sleep apnea (adult) (pediatric): Secondary | ICD-10-CM | POA: Diagnosis not present

## 2016-03-20 NOTE — Telephone Encounter (Signed)
Poke to pt and[employer], [employer] WAS GIVEN ALL THE CODES NEEDED TO GET HIS NEW CPAP AND SUPPLIES Jeremy Cardenas

## 2016-06-28 DIAGNOSIS — R69 Illness, unspecified: Secondary | ICD-10-CM | POA: Diagnosis not present

## 2016-07-01 DIAGNOSIS — G4733 Obstructive sleep apnea (adult) (pediatric): Secondary | ICD-10-CM | POA: Diagnosis not present

## 2016-08-05 DIAGNOSIS — R0602 Shortness of breath: Secondary | ICD-10-CM | POA: Diagnosis not present

## 2016-08-05 DIAGNOSIS — J069 Acute upper respiratory infection, unspecified: Secondary | ICD-10-CM | POA: Diagnosis not present

## 2016-08-05 DIAGNOSIS — B9689 Other specified bacterial agents as the cause of diseases classified elsewhere: Secondary | ICD-10-CM | POA: Diagnosis not present

## 2016-08-21 DIAGNOSIS — R69 Illness, unspecified: Secondary | ICD-10-CM | POA: Diagnosis not present

## 2016-10-01 DIAGNOSIS — H2513 Age-related nuclear cataract, bilateral: Secondary | ICD-10-CM | POA: Diagnosis not present

## 2016-10-01 DIAGNOSIS — H04123 Dry eye syndrome of bilateral lacrimal glands: Secondary | ICD-10-CM | POA: Diagnosis not present

## 2016-10-01 DIAGNOSIS — H25012 Cortical age-related cataract, left eye: Secondary | ICD-10-CM | POA: Diagnosis not present

## 2017-02-19 ENCOUNTER — Telehealth: Payer: Self-pay | Admitting: Adult Health

## 2017-02-19 NOTE — Telephone Encounter (Signed)
Spoke with patient's wife Collie Siad. Advised her that since the patient had not been seen since 2016, he will need to have an OV for insurance reasons. Appt was made with TP for 02/20/17 at 4pm. She is aware of the appt. Will call [facility] to provide a DL.

## 2017-02-20 ENCOUNTER — Encounter: Payer: Self-pay | Admitting: Adult Health

## 2017-02-20 ENCOUNTER — Ambulatory Visit (INDEPENDENT_AMBULATORY_CARE_PROVIDER_SITE_OTHER): Payer: Medicare HMO | Admitting: Adult Health

## 2017-02-20 DIAGNOSIS — G4733 Obstructive sleep apnea (adult) (pediatric): Secondary | ICD-10-CM | POA: Diagnosis not present

## 2017-02-20 NOTE — Patient Instructions (Signed)
Continue on CPAP at bedtime.  Do not drive if sleepy  Remain active , work on weight loss. Follow up Dr. Elsworth Soho  In 1 year and As needed

## 2017-02-20 NOTE — Assessment & Plan Note (Signed)
Excellent control and compliance on C Pap  Plan  Patient Instructions  Continue on CPAP at bedtime.  Do not drive if sleepy  Remain active , work on weight loss. Follow up Dr. Elsworth Soho  In 1 year and As needed

## 2017-02-20 NOTE — Progress Notes (Signed)
@Patient  ID: Jeremy Cardenas, male    DOB: Jan 13, 1946, 71 y.o.   MRN: [id number]  Chief Complaint  Patient presents with  . Follow-up    OSA    Referring provider: Dorena Cookey, MD  HPI: 71 year old male followed for obstructive sleep apnea.   TEST  Test PSG May 2001> baseline RDI 52/h corrected by CPAP 7 cm 9/01   02/20/2017 Follow up : OSA  Patient presents for a yearly follow-up for sleep apnea. Patient says he is doing very well on his C Pap machine. He feels rested with no significant daytime sleepiness. Patient says he never misses a night. Download shows excellent compliance with average usage around 7 hours. He is on a set pressure of 7 cm H2O. AHI 2.0. Minimum leaks. Discussed weight loss.   No Known Allergies  Immunization History  Administered Date(s) Administered  . Influenza Split 07/02/2011  . Influenza Whole 06/11/2007, 05/28/2010, 04/28/2012  . Influenza, High Dose Seasonal PF 05/25/2013, 04/28/2016  . Pneumococcal Polysaccharide-23 05/28/2010  . Td 07/29/1997, 08/22/2008    Past Medical History:  Diagnosis Date  . Colon polyps   . Hearing loss   . Hyperlipidemia   . Hypothyroidism   . Internal hemorrhoids   . Leiomyoma   . Sigmoid diverticulosis   . Sleep apnea     Tobacco History: History  Smoking Status  . Former Smoker  . Packs/day: 1.00  . Years: 20.00  . Types: Cigarettes  . Quit date: 07/29/1981  Smokeless Tobacco  . Never Used   Counseling given: Not Answered   Outpatient Encounter Prescriptions as of 02/20/2017  Medication Sig  . aspirin 81 MG tablet Take 81 mg by mouth daily.  Marland Kitchen co-enzyme Q-10 30 MG capsule Take 30 mg by mouth daily.  Marland Kitchen glucosamine-chondroitin 500-400 MG tablet Take 1 tablet by mouth 3 (three) times daily.  . Omega-3 Fatty Acids (SALMON OIL-1000) 200 MG CAPS Take by mouth daily.  Marland Kitchen OVER THE COUNTER MEDICATION 600 mg daily. 2 pills daily  . saw palmetto 160 MG capsule Take 160 mg by mouth 2 (two) times  daily.  . Turmeric POWD by Does not apply route.  . fluticasone (FLONASE) 50 MCG/ACT nasal spray Place 1 spray into both nostrils daily.   No facility-administered encounter medications on file as of 02/20/2017.      Review of Systems  Constitutional:   No  weight loss, night sweats,  Fevers, chills, fatigue, or  lassitude.  HEENT:   No headaches,  Difficulty swallowing,  Tooth/dental problems, or  Sore throat,                No sneezing, itching, ear ache, nasal congestion, post nasal drip,   CV:  No chest pain,  Orthopnea, PND, swelling in lower extremities, anasarca, dizziness, palpitations, syncope.   GI  No heartburn, indigestion, abdominal pain, nausea, vomiting, diarrhea, change in bowel habits, loss of appetite, bloody stools.   Resp: No shortness of breath with exertion or at rest.  No excess mucus, no productive cough,  No non-productive cough,  No coughing up of blood.  No change in color of mucus.  No wheezing.  No chest wall deformity  Skin: no rash or lesions.  GU: no dysuria, change in color of urine, no urgency or frequency.  No flank pain, no hematuria   MS:  No joint pain or swelling.  No decreased range of motion.  No back pain.    Physical Exam  BP 110/64 (  BP Location: Left Arm, Cuff Size: Normal)   Pulse 66   Ht 5\' 9"  (1.753 m)   Wt 204 lb 6.4 oz (92.7 kg)   SpO2 96%   BMI 30.18 kg/m   GEN: A/Ox3; pleasant , NAD, overweight    HEENT:  [place]/AT,  EACs-clear, TMs-wnl, NOSE-clear, THROAT-clear, no lesions, no postnasal drip or exudate noted.   NECK:  Supple w/ fair ROM; no JVD; normal carotid impulses w/o bruits; no thyromegaly or nodules palpated; no lymphadenopathy.    RESP  Clear  P & A; w/o, wheezes/ rales/ or rhonchi. no accessory muscle use, no dullness to percussion  CARD:  RRR, no m/r/g, no peripheral edema, pulses intact, no cyanosis or clubbing.  GI:   Soft & nt; nml bowel sounds; no organomegaly or masses detected.   Musco: Warm bil, no  deformities or joint swelling noted.   Neuro: alert, no focal deficits noted.    Skin: Warm, no lesions or rashes   BMET  BNP No results found for: BNP  ProBNP No results found for: PROBNP  Imaging: No results found.   Assessment & Plan:   Obstructive sleep apnea Excellent control and compliance on C Pap  Plan  Patient Instructions  Continue on CPAP at bedtime.  Do not drive if sleepy  Remain active , work on weight loss. Follow up Dr. Elsworth Soho  In 1 year and As needed           Rexene Edison, NP 02/20/2017

## 2017-02-26 ENCOUNTER — Telehealth: Payer: Self-pay | Admitting: Pulmonary Disease

## 2017-02-26 DIAGNOSIS — G4733 Obstructive sleep apnea (adult) (pediatric): Secondary | ICD-10-CM

## 2017-02-26 NOTE — Telephone Encounter (Signed)
Called and spoke to pt. Pt is requesting CPAP supplies. Pt last seen by TP on 02/20/17 for OSA. New order placed with [facility] for new supplies. Pt verbalized understanding and denied any further questions or concerns at this time.

## 2017-04-11 ENCOUNTER — Telehealth: Payer: Self-pay | Admitting: Family Medicine

## 2017-04-11 NOTE — Telephone Encounter (Signed)
Pt would like to re-est with dr todd. Pt was last seen may 2015. Can I sch?

## 2017-04-15 NOTE — Telephone Encounter (Signed)
Pt is requesting to re establish with you for a CPE, last see in 2015. Please Tacey Heap

## 2017-04-15 NOTE — Telephone Encounter (Signed)
Called pt left a message to return my call in the office to schedule an appointmen with dr Sherren Mocha

## 2017-04-16 NOTE — Telephone Encounter (Signed)
Jeremy Cardenas please call and set him up for physical examination sometime this fall. If he needs any meds refilled go ahead and refill his medications for 3 months until he can see him thank you

## 2017-04-17 NOTE — Telephone Encounter (Signed)
Called pt left a message for pt to return my call to schedule him for a physical app

## 2017-04-18 ENCOUNTER — Encounter: Payer: Self-pay | Admitting: Family Medicine

## 2017-04-22 NOTE — Telephone Encounter (Signed)
Pt has an appointment for CPE on 05/21/2017 at 10 am.

## 2017-04-24 DIAGNOSIS — G4733 Obstructive sleep apnea (adult) (pediatric): Secondary | ICD-10-CM | POA: Diagnosis not present

## 2017-04-24 DIAGNOSIS — R69 Illness, unspecified: Secondary | ICD-10-CM | POA: Diagnosis not present

## 2017-05-21 ENCOUNTER — Encounter: Payer: Medicare HMO | Admitting: Family Medicine

## 2017-06-11 ENCOUNTER — Encounter: Payer: Self-pay | Admitting: Family Medicine

## 2017-06-11 ENCOUNTER — Ambulatory Visit (INDEPENDENT_AMBULATORY_CARE_PROVIDER_SITE_OTHER): Payer: Medicare HMO | Admitting: Family Medicine

## 2017-06-11 VITALS — BP 130/74 | HR 58 | Temp 98.1°F | Ht 69.0 in | Wt 209.0 lb

## 2017-06-11 DIAGNOSIS — H9193 Unspecified hearing loss, bilateral: Secondary | ICD-10-CM

## 2017-06-11 DIAGNOSIS — N401 Enlarged prostate with lower urinary tract symptoms: Secondary | ICD-10-CM

## 2017-06-11 DIAGNOSIS — N138 Other obstructive and reflux uropathy: Secondary | ICD-10-CM | POA: Diagnosis not present

## 2017-06-11 DIAGNOSIS — R3911 Hesitancy of micturition: Secondary | ICD-10-CM

## 2017-06-11 DIAGNOSIS — N529 Male erectile dysfunction, unspecified: Secondary | ICD-10-CM

## 2017-06-11 DIAGNOSIS — Z0000 Encounter for general adult medical examination without abnormal findings: Secondary | ICD-10-CM

## 2017-06-11 LAB — CBC WITH DIFFERENTIAL/PLATELET
BASOS PCT: 0.7 % (ref 0.0–3.0)
Basophils Absolute: 0 10*3/uL (ref 0.0–0.1)
EOS ABS: 0.1 10*3/uL (ref 0.0–0.7)
Eosinophils Relative: 2.4 % (ref 0.0–5.0)
HEMATOCRIT: 45.2 % (ref 39.0–52.0)
Hemoglobin: 15.1 g/dL (ref 13.0–17.0)
LYMPHS ABS: 1.7 10*3/uL (ref 0.7–4.0)
LYMPHS PCT: 32.4 % (ref 12.0–46.0)
MCHC: 33.5 g/dL (ref 30.0–36.0)
MCV: 92.9 fl (ref 78.0–100.0)
Monocytes Absolute: 0.5 10*3/uL (ref 0.1–1.0)
Monocytes Relative: 8.5 % (ref 3.0–12.0)
NEUTROS ABS: 3 10*3/uL (ref 1.4–7.7)
NEUTROS PCT: 56 % (ref 43.0–77.0)
PLATELETS: 182 10*3/uL (ref 150.0–400.0)
RBC: 4.87 Mil/uL (ref 4.22–5.81)
RDW: 14.1 % (ref 11.5–15.5)
WBC: 5.4 10*3/uL (ref 4.0–10.5)

## 2017-06-11 LAB — POCT URINALYSIS DIPSTICK
BILIRUBIN UA: NEGATIVE
GLUCOSE UA: NEGATIVE
KETONES UA: NEGATIVE
Leukocytes, UA: NEGATIVE
Nitrite, UA: NEGATIVE
PH UA: 6 (ref 5.0–8.0)
Protein, UA: NEGATIVE
RBC UA: NEGATIVE
Spec Grav, UA: 1.02 (ref 1.010–1.025)
Urobilinogen, UA: 0.2 E.U./dL

## 2017-06-11 LAB — HEPATIC FUNCTION PANEL
ALK PHOS: 54 U/L (ref 39–117)
ALT: 25 U/L (ref 0–53)
AST: 22 U/L (ref 0–37)
Albumin: 4.1 g/dL (ref 3.5–5.2)
Bilirubin, Direct: 0.1 mg/dL (ref 0.0–0.3)
TOTAL PROTEIN: 7.1 g/dL (ref 6.0–8.3)
Total Bilirubin: 0.7 mg/dL (ref 0.2–1.2)

## 2017-06-11 LAB — LIPID PANEL
CHOL/HDL RATIO: 4
CHOLESTEROL: 163 mg/dL (ref 0–200)
HDL: 37.3 mg/dL — ABNORMAL LOW (ref >39.00)
LDL Cholesterol: 100 mg/dL — ABNORMAL HIGH (ref 0–99)
NonHDL: 125.65
TRIGLYCERIDES: 126 mg/dL (ref 0.0–149.0)
VLDL: 25.2 mg/dL (ref 0.0–40.0)

## 2017-06-11 LAB — PSA: PSA: 1.85 ng/mL (ref 0.10–4.00)

## 2017-06-11 LAB — BASIC METABOLIC PANEL
BUN: 15 mg/dL (ref 6–23)
CALCIUM: 9.2 mg/dL (ref 8.4–10.5)
CO2: 28 mEq/L (ref 19–32)
CREATININE: 0.81 mg/dL (ref 0.40–1.50)
Chloride: 106 mEq/L (ref 96–112)
GFR: 99.11 mL/min (ref >60.00)
GLUCOSE: 103 mg/dL — AB (ref 70–99)
Potassium: 4.3 mEq/L (ref 3.5–5.1)
SODIUM: 138 meq/L (ref 135–145)

## 2017-06-11 LAB — TSH: TSH: 1.87 u[IU]/mL (ref 0.35–4.50)

## 2017-06-11 MED ORDER — TAMSULOSIN HCL 0.4 MG PO CAPS
0.4000 mg | ORAL_CAPSULE | Freq: Every day | ORAL | 4 refills | Status: DC
Start: 2017-06-11 — End: 2018-09-15

## 2017-06-11 MED ORDER — SILDENAFIL CITRATE 20 MG PO TABS: ORAL_TABLET | ORAL | 11 refills | Status: DC

## 2017-06-11 NOTE — Progress Notes (Signed)
Jeremy Cardenas is a 71 year old married male nonsmoker who comes in today for general physical examination  He's always been Health she's had no chronic health problems except for allergic rhinitis. He takes over-the-counter medication  He's been using saw palmetto because of difficulty urinating. He only has nocturia 1 but he has dribbling and difficulties guarding his urine. He would like to discuss and treatment options  He gets routine eye care, dental care, due for colonoscopy. He had a letter that said he was [place] hasn't contacted them yet. Encouraged him to contact them this fall  Vaccinations up-to-date except he is due the new shingles vaccine. Information given  He's had difficulty hearing. He went to [place] got hearing aids but didn't work. I'll refer him to Dr. Tonia Ghent at [facility] for consultation  14 point review of systems reviewed and otherwise negative  EKG was done because of his age. EKG was normal and unchanged.  He is also having difficulty with mild ED. He tried the 50 mg Viagra but it gave him a headache.  BP 130/74 (BP Location: Left Arm, Patient Position: Sitting, Cuff Size: Normal)   Pulse (!) 58   Temp 98.1 F (36.7 C) (Oral)   Ht 5\' 9"  (1.753 m)   Wt 209 lb (94.8 kg)   BMI 30.86 kg/m  Gel was well-developed well-nourished male no acute distress vital signs stable he is afebrile HEENT were negative neck was supple thyroid is not enlarged no carotid bruits cardiopulmonary exam normal abdominal exam normal genitalia normal circumcised male rectum normal stool guaiac-negative prostate smooth nonnodular 1+ BPH guaiac negative extremities normal skin no peripheral pulses normal  #1 healthy male  #2 allergic rhinitis.....Marland Kitchen continue OTC medicine  #3 profound hearing loss...Marland KitchenMarland KitchenMarland Kitchen recommend that Dr. Tonia Ghent at [facility]  #4 BPH with dribbling......... trial of Flomax.

## 2017-06-11 NOTE — Patient Instructions (Addendum)
Walk 30 minutes daily  I would consult with Dr. Tonia Ghent at [facility] ear nose and throat  Flomax 0.4......... one daily at bedtime,,,,,,,,,,,, it may take 4-6 weeks before you see the ultimate affect. If after that time your still not happy with your waterworks call and we'll discuss other options  Call your insurance company to find out where he can get the new shingles vaccine  Labs today,,,,,,,,,,, I will call you if there is anything abnormal

## 2017-06-12 ENCOUNTER — Telehealth: Payer: Self-pay | Admitting: Family Medicine

## 2017-06-12 NOTE — Telephone Encounter (Signed)
Copied from [place] [id number]. Topic: Quick Communication - See Telephone Encounter >> Jun 12, 2017  1:17 PM Robina Ade, Helene Kelp D wrote: Reason for CRM: Beverlee Nims with [facility] called and she would like to talk to cma or provider regarding patients medication  sildenafil (REVATIO) 20 MG tablet. She would like to know if this medication is use for pulmonary reasons or something else. Please call her back [phone] reference number [id number]. CRM for notification. See Telephone encounter for: 06/12/17.

## 2017-06-13 NOTE — Telephone Encounter (Signed)
Spoke with [employer] in regards to pt diagnosis for his Sildenafil (Revatio) 20 mg prescription. [employer] denied the Rx, stated that the diagnosis provided is not covered by pt insurance.

## 2017-06-27 ENCOUNTER — Ambulatory Visit: Payer: Medicare HMO

## 2017-09-03 DIAGNOSIS — R69 Illness, unspecified: Secondary | ICD-10-CM | POA: Diagnosis not present

## 2017-10-07 DIAGNOSIS — H2513 Age-related nuclear cataract, bilateral: Secondary | ICD-10-CM | POA: Diagnosis not present

## 2017-10-07 DIAGNOSIS — H1789 Other corneal scars and opacities: Secondary | ICD-10-CM | POA: Diagnosis not present

## 2017-10-07 DIAGNOSIS — H25012 Cortical age-related cataract, left eye: Secondary | ICD-10-CM | POA: Diagnosis not present

## 2017-10-07 DIAGNOSIS — H43813 Vitreous degeneration, bilateral: Secondary | ICD-10-CM | POA: Diagnosis not present

## 2018-09-09 DIAGNOSIS — R69 Illness, unspecified: Secondary | ICD-10-CM | POA: Diagnosis not present

## 2018-09-15 ENCOUNTER — Ambulatory Visit (INDEPENDENT_AMBULATORY_CARE_PROVIDER_SITE_OTHER): Payer: Medicare HMO | Admitting: Family Medicine

## 2018-09-15 ENCOUNTER — Ambulatory Visit (INDEPENDENT_AMBULATORY_CARE_PROVIDER_SITE_OTHER): Payer: Medicare HMO

## 2018-09-15 ENCOUNTER — Encounter: Payer: Self-pay | Admitting: Family Medicine

## 2018-09-15 VITALS — BP 120/70 | HR 61 | Ht 69.0 in | Wt 210.4 lb

## 2018-09-15 DIAGNOSIS — N401 Enlarged prostate with lower urinary tract symptoms: Secondary | ICD-10-CM | POA: Diagnosis not present

## 2018-09-15 DIAGNOSIS — M1711 Unilateral primary osteoarthritis, right knee: Secondary | ICD-10-CM | POA: Diagnosis not present

## 2018-09-15 DIAGNOSIS — R3911 Hesitancy of micturition: Secondary | ICD-10-CM | POA: Diagnosis not present

## 2018-09-15 DIAGNOSIS — M25561 Pain in right knee: Secondary | ICD-10-CM | POA: Diagnosis not present

## 2018-09-15 DIAGNOSIS — L853 Xerosis cutis: Secondary | ICD-10-CM | POA: Diagnosis not present

## 2018-09-15 MED ORDER — DICLOFENAC SODIUM 1 % TD GEL: TRANSDERMAL | 2 refills | Status: DC

## 2018-09-15 MED ORDER — TAMSULOSIN HCL 0.4 MG PO CAPS: 0.4000 mg | ORAL_CAPSULE | Freq: Every day | ORAL | 4 refills | Status: DC

## 2018-09-15 NOTE — Progress Notes (Signed)
Established Patient Office Visit  Subjective:  Patient ID: Jeremy Cardenas, male    DOB: 1945/08/21  Age: 73 y.o. MRN: [id number]  CC:  Chief Complaint  Patient presents with  . Establish Care    HPI [name] presents for patient is here for evaluation of right medial knee pain.  He misstepped a week or so ago and it is been painful ever since.  He has been taking Advil chronically for it.  No history of injury to this knee.  It does not lock or give way.  He is also been having some pain in the lateral aspect of his left foot.  No injury there as well.  History of BPH treated successfully with Flomax.  He takes saw palmetto as well with some relief he believes.  Past Medical History:  Diagnosis Date  . Colon polyps   . Hearing loss   . Hyperlipidemia   . Hypothyroidism   . Internal hemorrhoids   . Leiomyoma   . Sigmoid diverticulosis   . Sleep apnea     Past Surgical History:  Procedure Laterality Date  . ANKLE FUSION    . COLONOSCOPY W/ BIOPSIES    . INGUINAL HERNIA REPAIR    . rotator cuff surgery      Family History  Problem Relation Age of Onset  . Colon cancer Unknown   . Diabetes Unknown     Social History   Socioeconomic History  . Marital status: Married    Spouse name: Not on file  . Number of children: Not on file  . Years of education: Not on file  . Highest education level: Not on file  Occupational History  . Not on file  Social Needs  . Financial resource strain: Not on file  . Food insecurity:    Worry: Not on file    Inability: Not on file  . Transportation needs:    Medical: Not on file    Non-medical: Not on file  Tobacco Use  . Smoking status: Former Smoker    Packs/day: 1.00    Years: 20.00    Pack years: 20.00    Types: Cigarettes    Last attempt to quit: 07/29/1981    Years since quitting: 37.1  . Smokeless tobacco: Never Used  Substance and Sexual Activity  . Alcohol use: Yes    Comment: wine  . Drug use: No  .  Sexual activity: Not on file  Lifestyle  . Physical activity:    Days per week: Not on file    Minutes per session: Not on file  . Stress: Not on file  Relationships  . Social connections:    Talks on phone: Not on file    Gets together: Not on file    Attends religious service: Not on file    Active member of club or organization: Not on file    Attends meetings of clubs or organizations: Not on file    Relationship status: Not on file  . Intimate partner violence:    Fear of current or ex partner: Not on file    Emotionally abused: Not on file    Physically abused: Not on file    Forced sexual activity: Not on file  Other Topics Concern  . Not on file  Social History Narrative  . Not on file    Outpatient Medications Prior to Visit  Medication Sig Dispense Refill  . aspirin 81 MG tablet Take 81 mg by mouth  daily.    . co-enzyme Q-10 30 MG capsule Take 30 mg by mouth daily.    . fluticasone (FLONASE) 50 MCG/ACT nasal spray Place 1 spray into both nostrils daily.    Marland Kitchen glucosamine-chondroitin 500-400 MG tablet Take 1 tablet by mouth 3 (three) times daily.    . Omega-3 Fatty Acids (SALMON OIL-1000) 200 MG CAPS Take by mouth daily.    Marland Kitchen OVER THE COUNTER MEDICATION 600 mg daily. 2 pills daily    . saw palmetto 160 MG capsule Take 160 mg by mouth 2 (two) times daily.    . sildenafil (REVATIO) 20 MG tablet Take 1 to 2 tabs 2 - 3 hours before sex 30 tablet 11  . Turmeric POWD by Does not apply route.    . tamsulosin (FLOMAX) 0.4 MG CAPS capsule Take 1 capsule (0.4 mg total) daily by mouth. 100 capsule 4   No facility-administered medications prior to visit.     No Known Allergies  ROS Review of Systems  Constitutional: Negative.   Respiratory: Negative.   Cardiovascular: Negative.   Gastrointestinal: Negative.   Genitourinary: Negative for difficulty urinating, frequency and urgency.  Musculoskeletal: Positive for arthralgias and gait problem.  Skin: Positive for rash.  Negative for color change.  Allergic/Immunologic: Negative for immunocompromised state.  Neurological: Negative for seizures and numbness.  Psychiatric/Behavioral: Negative.       Objective:    Physical Exam  Constitutional: He is oriented to person, place, and time. He appears well-developed and well-nourished. No distress.  HENT:  Head: Normocephalic and atraumatic.  Right Ear: External ear normal.  Left Ear: External ear normal.  Eyes: Right eye exhibits no discharge. Left eye exhibits no discharge. No scleral icterus.  Neck: No JVD present. No tracheal deviation present.  Cardiovascular:  Pulses:      Dorsalis pedis pulses are 2+ on the left side.       Posterior tibial pulses are 2+ on the left side.  Pulmonary/Chest: Breath sounds normal. No stridor.  Musculoskeletal:     Right knee: He exhibits normal range of motion, no swelling, no effusion, normal patellar mobility and no MCL laxity. Tenderness found. Medial joint line tenderness noted.     Left foot: Normal range of motion. No tenderness or bony tenderness.  Neurological: He is alert and oriented to person, place, and time.  Skin: Skin is warm and dry. Rash noted. He is not diaphoretic.     Psychiatric: He has a normal mood and affect. His behavior is normal.    BP 120/70   Pulse 61   Ht 5\' 9"  (1.753 m)   Wt 210 lb 6 oz (95.4 kg)   SpO2 98%   BMI 31.07 kg/m  Wt Readings from Last 3 Encounters:  09/15/18 210 lb 6 oz (95.4 kg)  06/11/17 209 lb (94.8 kg)  02/20/17 204 lb 6.4 oz (92.7 kg)   BP Readings from Last 3 Encounters:  09/15/18 120/70  06/11/17 130/74  02/20/17 110/64   Guideline developer:  UpToDate (see UpToDate for funding source) Date Released: June 2014  Health Maintenance Due  Topic Date Due  . Hepatitis C Screening  Feb 29, 1946  . COLONOSCOPY  10/29/1993  . PNA vac Low Risk Adult (2 of 2 - PCV13) 05/29/2011  . TETANUS/TDAP  08/22/2018    There are no preventive care reminders to display  for this patient.  Lab Results  Component Value Date   TSH 1.87 06/11/2017   Lab Results  Component Value Date  WBC 5.4 06/11/2017   HGB 15.1 06/11/2017   HCT 45.2 06/11/2017   MCV 92.9 06/11/2017   PLT 182.0 06/11/2017   Lab Results  Component Value Date   NA 138 06/11/2017   K 4.3 06/11/2017   CO2 28 06/11/2017   GLUCOSE 103 (H) 06/11/2017   BUN 15 06/11/2017   CREATININE 0.81 06/11/2017   BILITOT 0.7 06/11/2017   ALKPHOS 54 06/11/2017   AST 22 06/11/2017   ALT 25 06/11/2017   PROT 7.1 06/11/2017   ALBUMIN 4.1 06/11/2017   CALCIUM 9.2 06/11/2017   GFR 99.11 06/11/2017   Lab Results  Component Value Date   CHOL 163 06/11/2017   Lab Results  Component Value Date   HDL 37.30 (L) 06/11/2017   Lab Results  Component Value Date   LDLCALC 100 (H) 06/11/2017   Lab Results  Component Value Date   TRIG 126.0 06/11/2017   Lab Results  Component Value Date   CHOLHDL 4 06/11/2017   No results found for: HGBA1C    Assessment & Plan:   Problem List Items Addressed This Visit      Musculoskeletal and Integument   Primary osteoarthritis of right knee - Primary   Relevant Medications   diclofenac sodium (VOLTAREN) 1 % GEL   Other Relevant Orders   DG Knee Complete 4 Views Right   Xerosis cutis     Genitourinary   Benign prostatic hyperplasia with urinary hesitancy   Relevant Medications   tamsulosin (FLOMAX) 0.4 MG CAPS capsule      Meds ordered this encounter  Medications  . diclofenac sodium (VOLTAREN) 1 % GEL    Sig: Apply a small amount to the sore space on the knee 3 times a day as needed.    Dispense:  200 g    Refill:  2  . tamsulosin (FLOMAX) 0.4 MG CAPS capsule    Sig: Take 1 capsule (0.4 mg total) by mouth daily.    Dispense:  100 capsule    Refill:  4    Follow-up: Return Return fasting for complete physical exam.

## 2018-09-17 ENCOUNTER — Telehealth: Payer: Self-pay | Admitting: Family Medicine

## 2018-09-17 NOTE — Telephone Encounter (Signed)
I called and spoke with patient. I let him know that we are waiting on a response from the insurance company. He verbalized understanding.

## 2018-09-17 NOTE — Telephone Encounter (Signed)
Copied from [place] [id number]. Topic: General - Other >> Sep 17, 2018  9:38 AM Oneta Rack wrote: Relation to pt: self  Call back number: home # [phone] okay to speak with pt wife (DPR reflected)  Pharmacy: [id number] - [place], [address] [phone] (Phone) [phone] (Fax)   Reason for call:  Patient was informed by the pharmacy a PA is needed for diclofenac sodium (VOLTAREN) 1 % GEL, pharmacist states PCP was notified on 09/15/2018, patient would like to know if PA was initiated, please advise

## 2018-09-18 ENCOUNTER — Other Ambulatory Visit: Payer: Self-pay

## 2018-09-18 DIAGNOSIS — M1711 Unilateral primary osteoarthritis, right knee: Secondary | ICD-10-CM

## 2018-09-18 MED ORDER — DICLOFENAC SODIUM 1 % TD GEL: TRANSDERMAL | 2 refills | Status: DC

## 2018-09-30 ENCOUNTER — Encounter: Payer: Self-pay | Admitting: Internal Medicine

## 2018-10-09 ENCOUNTER — Encounter: Payer: Self-pay | Admitting: Internal Medicine

## 2018-10-14 DIAGNOSIS — M25569 Pain in unspecified knee: Secondary | ICD-10-CM | POA: Diagnosis not present

## 2018-10-14 DIAGNOSIS — M25559 Pain in unspecified hip: Secondary | ICD-10-CM | POA: Diagnosis not present

## 2018-10-14 DIAGNOSIS — M9904 Segmental and somatic dysfunction of sacral region: Secondary | ICD-10-CM | POA: Diagnosis not present

## 2018-10-21 DIAGNOSIS — M9904 Segmental and somatic dysfunction of sacral region: Secondary | ICD-10-CM | POA: Diagnosis not present

## 2018-10-21 DIAGNOSIS — M25569 Pain in unspecified knee: Secondary | ICD-10-CM | POA: Diagnosis not present

## 2018-10-21 DIAGNOSIS — M25559 Pain in unspecified hip: Secondary | ICD-10-CM | POA: Diagnosis not present

## 2018-10-28 DIAGNOSIS — M9904 Segmental and somatic dysfunction of sacral region: Secondary | ICD-10-CM | POA: Diagnosis not present

## 2018-10-28 DIAGNOSIS — M25559 Pain in unspecified hip: Secondary | ICD-10-CM | POA: Diagnosis not present

## 2018-10-28 DIAGNOSIS — M25569 Pain in unspecified knee: Secondary | ICD-10-CM | POA: Diagnosis not present

## 2018-11-12 ENCOUNTER — Encounter: Payer: Medicare HMO | Admitting: Internal Medicine

## 2018-12-11 DIAGNOSIS — M2550 Pain in unspecified joint: Secondary | ICD-10-CM | POA: Diagnosis not present

## 2018-12-11 DIAGNOSIS — A6920 Lyme disease, unspecified: Secondary | ICD-10-CM | POA: Diagnosis not present

## 2018-12-15 NOTE — Telephone Encounter (Signed)
Opened in error

## 2018-12-15 NOTE — Telephone Encounter (Deleted)
Pt recently dx w/ lyme disease- a family friend of mine- requesting to see if Dr. Larose Kells will accept him as NP.

## 2018-12-30 DIAGNOSIS — M2540 Effusion, unspecified joint: Secondary | ICD-10-CM | POA: Diagnosis not present

## 2019-01-01 DIAGNOSIS — E559 Vitamin D deficiency, unspecified: Secondary | ICD-10-CM | POA: Diagnosis not present

## 2019-01-01 DIAGNOSIS — M353 Polymyalgia rheumatica: Secondary | ICD-10-CM | POA: Diagnosis not present

## 2019-01-04 DIAGNOSIS — M25551 Pain in right hip: Secondary | ICD-10-CM | POA: Diagnosis not present

## 2019-01-04 DIAGNOSIS — M25552 Pain in left hip: Secondary | ICD-10-CM | POA: Diagnosis not present

## 2019-01-04 DIAGNOSIS — M353 Polymyalgia rheumatica: Secondary | ICD-10-CM | POA: Diagnosis not present

## 2019-01-04 DIAGNOSIS — R5081 Fever presenting with conditions classified elsewhere: Secondary | ICD-10-CM | POA: Diagnosis not present

## 2019-01-04 DIAGNOSIS — M1711 Unilateral primary osteoarthritis, right knee: Secondary | ICD-10-CM | POA: Diagnosis not present

## 2019-01-15 DIAGNOSIS — E559 Vitamin D deficiency, unspecified: Secondary | ICD-10-CM | POA: Diagnosis not present

## 2019-01-15 DIAGNOSIS — R7982 Elevated C-reactive protein (CRP): Secondary | ICD-10-CM | POA: Diagnosis not present

## 2019-01-15 DIAGNOSIS — R700 Elevated erythrocyte sedimentation rate: Secondary | ICD-10-CM | POA: Diagnosis not present

## 2019-01-15 DIAGNOSIS — M353 Polymyalgia rheumatica: Secondary | ICD-10-CM | POA: Diagnosis not present

## 2019-02-01 DIAGNOSIS — Z7189 Other specified counseling: Secondary | ICD-10-CM | POA: Diagnosis not present

## 2019-02-01 DIAGNOSIS — Z20828 Contact with and (suspected) exposure to other viral communicable diseases: Secondary | ICD-10-CM | POA: Diagnosis not present

## 2019-02-24 ENCOUNTER — Telehealth: Payer: Self-pay | Admitting: Primary Care

## 2019-02-24 NOTE — Telephone Encounter (Signed)
Left message for patient.   Spoke with [employer]. Due to recent travel history, patient will need to be switched to a televisit instead of in-office visit.

## 2019-02-25 ENCOUNTER — Other Ambulatory Visit: Payer: Self-pay

## 2019-02-25 ENCOUNTER — Encounter: Payer: Self-pay | Admitting: Primary Care

## 2019-02-25 ENCOUNTER — Ambulatory Visit (INDEPENDENT_AMBULATORY_CARE_PROVIDER_SITE_OTHER): Payer: Medicare HMO | Admitting: Primary Care

## 2019-02-25 ENCOUNTER — Other Ambulatory Visit: Payer: Self-pay | Admitting: General Surgery

## 2019-02-25 DIAGNOSIS — Z9989 Dependence on other enabling machines and devices: Secondary | ICD-10-CM | POA: Diagnosis not present

## 2019-02-25 DIAGNOSIS — G4733 Obstructive sleep apnea (adult) (pediatric): Secondary | ICD-10-CM

## 2019-02-25 DIAGNOSIS — M353 Polymyalgia rheumatica: Secondary | ICD-10-CM | POA: Diagnosis not present

## 2019-02-25 DIAGNOSIS — R700 Elevated erythrocyte sedimentation rate: Secondary | ICD-10-CM | POA: Diagnosis not present

## 2019-02-25 DIAGNOSIS — R7982 Elevated C-reactive protein (CRP): Secondary | ICD-10-CM | POA: Diagnosis not present

## 2019-02-25 DIAGNOSIS — E559 Vitamin D deficiency, unspecified: Secondary | ICD-10-CM | POA: Diagnosis not present

## 2019-02-25 NOTE — Progress Notes (Signed)
Virtual Visit via Telephone Note  I connected with Jeremy Cardenas on 02/25/19 at  9:00 AM EDT by telephone and verified that I am speaking with the correct person using two identifiers.  Location: Patient: Home Provider: Office   I discussed the limitations, risks, security and privacy concerns of performing an evaluation and management service by telephone and the availability of in person appointments. I also discussed with the patient that there may be a patient responsible charge related to this service. The patient expressed understanding and agreed to proceed.   History of Present Illness: 73 year old male, former smoker quit in [place] (20 pack year hx). PMH significant for Obstructive sleep apnea, allergic rhinitis, hearing loss, benign prostatic hyperplasia, PMR. Patient of Dr. Elsworth Soho, last seen by pulmonary NP on 02/20/17. PSG May 2001>baseline RDI 52/h corrected by CPAP 7.   197 weight. Joint pain still bothers him but nothing like it was. Mostly with his hand, knees, hip. Following with ID and rheumatology. Pain is not felt to be related to lyme.   02/25/2019 Patient contact today for televisit/OSA follow-up. He is doing well. No complaints. Compliant with CPAP use. Needs new nasal pillow mask, DME company is [employer]. Sleeping well, he was waking up with joint pain. Joint pain still bothers him but nothing like it was. Mostly with his hands, knees, hip. Following with ID and rheumatology. Pain is not felt to be related to lyme but PMR. Currently on methotrexate with prednisone. He has an apt with rheum today. Weight down to 197lbs.    Observations/Objective:  - No shortness of breath, wheezing or cough  Assessment and Plan:  OSA: - 100% compliant with CPAP and reports benefit from use  - Pressure 7cm H20; AHI 1.4 - No changes - Renew supplies and referral for new nasal pillow mask with [employer] - Do not drive if experiencing excessive daytime fatigue or somnolece - Continue to work  on weight loss   Follow Up Instructions:  - FU in 1 year with Dr. Elsworth Soho   I discussed the assessment and treatment plan with the patient. The patient was provided an opportunity to ask questions and all were answered. The patient agreed with the plan and demonstrated an understanding of the instructions.   The patient was advised to call back or seek an in-person evaluation if the symptoms worsen or if the condition fails to improve as anticipated.  I provided 15 minutes of non-face-to-face time during this encounter.   Martyn Ehrich, NP

## 2019-02-25 NOTE — Telephone Encounter (Signed)
PT CALLED BACK THIS MORNING AND HE HAS BEEN MADE AWARE THAT HIS OV IS NOW A TV AND HE WAS FINE WITH THAT.Jeremy Cardenas

## 2019-02-25 NOTE — Patient Instructions (Addendum)
Excellent compliance with CPAP  Continue to wear every night for 4-6 hours or more  Do not drive if experiencing excessive daytime fatigue or somnolence  Referral- renew supplies with [employer], please provider new mask   Follow up in 1 year with Dr. Elsworth Soho    CPAP and BPAP Information CPAP and BPAP are methods of helping a person breathe with the use of air pressure. CPAP stands for "continuous positive airway pressure." BPAP stands for "bi-level positive airway pressure." In both methods, air is blown through your nose or mouth and into your air passages to help you breathe well. CPAP and BPAP use different amounts of pressure to blow air. With CPAP, the amount of pressure stays the same while you breathe in and out. With BPAP, the amount of pressure is increased when you breathe in (inhale) so that you can take larger breaths. Your health care provider will recommend whether CPAP or BPAP would be more helpful for you. Why are CPAP and BPAP treatments used? CPAP or BPAP can be helpful if you have:  Sleep apnea.  Chronic obstructive pulmonary disease (COPD).  Heart failure.  Medical conditions that weaken the muscles of the chest including muscular dystrophy, or neurological diseases such as amyotrophic lateral sclerosis (ALS).  Other problems that cause breathing to be weak, abnormal, or difficult. CPAP is most commonly used for obstructive sleep apnea (OSA) to keep the airways from collapsing when the muscles relax during sleep. How is CPAP or BPAP administered? Both CPAP and BPAP are provided by a small machine with a flexible plastic tube that attaches to a plastic mask. You wear the mask. Air is blown through the mask into your nose or mouth. The amount of pressure that is used to blow the air can be adjusted on the machine. Your health care provider will determine the pressure setting that should be used based on your individual needs. When should CPAP or BPAP be used? In most cases,  the mask only needs to be worn during sleep. Generally, the mask needs to be worn throughout the night and during any daytime naps. People with certain medical conditions may also need to wear the mask at other times when they are awake. Follow instructions from your health care provider about when to use the machine. What are some tips for using the mask?   Because the mask needs to be snug, some people feel trapped or closed-in (claustrophobic) when first using the mask. If you feel this way, you may need to get used to the mask. One way to do this is by holding the mask loosely over your nose or mouth and then gradually applying the mask more snugly. You can also gradually increase the amount of time that you use the mask.  Masks are available in various types and sizes. Some fit over your mouth and nose while others fit over just your nose. If your mask does not fit well, talk with your health care provider about getting a different one.  If you are using a mask that fits over your nose and you tend to breathe through your mouth, a chin strap may be applied to help keep your mouth closed.  The CPAP and BPAP machines have alarms that may sound if the mask comes off or develops a leak.  If you have trouble with the mask, it is very important that you talk with your health care provider about finding a way to make the mask easier to tolerate. Do  not stop using the mask. Stopping the use of the mask could have a negative impact on your health. What are some tips for using the machine?  Place your CPAP or BPAP machine on a secure table or stand near an electrical outlet.  Know where the on/off switch is located on the machine.  Follow instructions from your health care provider about how to set the pressure on your machine and when you should use it.  Do not eat or drink while the CPAP or BPAP machine is on. Food or fluids could get pushed into your lungs by the pressure of the CPAP or BPAP.   Do not smoke. Tobacco smoke residue can damage the machine.  For home use, CPAP and BPAP machines can be rented or purchased through home health care companies. Many different brands of machines are available. Renting a machine before purchasing may help you find out which particular machine works well for you.  Keep the CPAP or BPAP machine and attachments clean. Ask your health care provider for specific instructions. Get help right away if:  You have redness or open areas around your nose or mouth where the mask fits.  You have trouble using the CPAP or BPAP machine.  You cannot tolerate wearing the CPAP or BPAP mask.  You have pain, discomfort, and bloating in your abdomen. Summary  CPAP and BPAP are methods of helping a person breathe with the use of air pressure.  Both CPAP and BPAP are provided by a small machine with a flexible plastic tube that attaches to a plastic mask.  If you have trouble with the mask, it is very important that you talk with your health care provider about finding a way to make the mask easier to tolerate. This information is not intended to replace advice given to you by your health care provider. Make sure you discuss any questions you have with your health care provider. Document Released: 04/12/2004 Document Revised: 11/04/2018 Document Reviewed: 06/03/2016 Elsevier Patient Education  2020 [employer].

## 2019-02-25 NOTE — Telephone Encounter (Signed)
Will close this encounter since nothing further is needed.  

## 2019-03-05 DIAGNOSIS — G4733 Obstructive sleep apnea (adult) (pediatric): Secondary | ICD-10-CM | POA: Diagnosis not present

## 2019-03-15 ENCOUNTER — Telehealth: Payer: Self-pay | Admitting: Family Medicine

## 2019-03-15 NOTE — Telephone Encounter (Signed)
I called pt based on last appt, Dr Ethelene Hal wanted pt to return for CPE and fasting labs. Pt answered and said that he is actually looking for a new PCP and said not to worry about it

## 2019-04-05 DIAGNOSIS — G4733 Obstructive sleep apnea (adult) (pediatric): Secondary | ICD-10-CM | POA: Diagnosis not present

## 2019-05-05 DIAGNOSIS — G4733 Obstructive sleep apnea (adult) (pediatric): Secondary | ICD-10-CM | POA: Diagnosis not present

## 2019-05-27 DIAGNOSIS — E559 Vitamin D deficiency, unspecified: Secondary | ICD-10-CM | POA: Diagnosis not present

## 2019-05-27 DIAGNOSIS — M353 Polymyalgia rheumatica: Secondary | ICD-10-CM | POA: Diagnosis not present

## 2019-05-27 DIAGNOSIS — R700 Elevated erythrocyte sedimentation rate: Secondary | ICD-10-CM | POA: Diagnosis not present

## 2019-05-27 DIAGNOSIS — R7982 Elevated C-reactive protein (CRP): Secondary | ICD-10-CM | POA: Diagnosis not present

## 2019-08-04 DIAGNOSIS — G4733 Obstructive sleep apnea (adult) (pediatric): Secondary | ICD-10-CM | POA: Diagnosis not present

## 2019-08-30 DIAGNOSIS — R7982 Elevated C-reactive protein (CRP): Secondary | ICD-10-CM | POA: Diagnosis not present

## 2019-08-30 DIAGNOSIS — R700 Elevated erythrocyte sedimentation rate: Secondary | ICD-10-CM | POA: Diagnosis not present

## 2019-08-30 DIAGNOSIS — M353 Polymyalgia rheumatica: Secondary | ICD-10-CM | POA: Diagnosis not present

## 2019-08-30 DIAGNOSIS — E559 Vitamin D deficiency, unspecified: Secondary | ICD-10-CM | POA: Diagnosis not present

## 2019-09-04 DIAGNOSIS — G4733 Obstructive sleep apnea (adult) (pediatric): Secondary | ICD-10-CM | POA: Diagnosis not present

## 2019-09-13 DIAGNOSIS — R69 Illness, unspecified: Secondary | ICD-10-CM | POA: Diagnosis not present

## 2019-10-02 DIAGNOSIS — G4733 Obstructive sleep apnea (adult) (pediatric): Secondary | ICD-10-CM | POA: Diagnosis not present

## 2019-10-07 DIAGNOSIS — R69 Illness, unspecified: Secondary | ICD-10-CM | POA: Diagnosis not present

## 2019-12-02 DIAGNOSIS — M353 Polymyalgia rheumatica: Secondary | ICD-10-CM | POA: Diagnosis not present

## 2019-12-02 DIAGNOSIS — Z79899 Other long term (current) drug therapy: Secondary | ICD-10-CM | POA: Diagnosis not present

## 2019-12-02 DIAGNOSIS — E559 Vitamin D deficiency, unspecified: Secondary | ICD-10-CM | POA: Diagnosis not present

## 2019-12-08 ENCOUNTER — Other Ambulatory Visit: Payer: Self-pay | Admitting: Family Medicine

## 2019-12-08 DIAGNOSIS — N401 Enlarged prostate with lower urinary tract symptoms: Secondary | ICD-10-CM

## 2019-12-08 DIAGNOSIS — R3911 Hesitancy of micturition: Secondary | ICD-10-CM

## 2020-01-12 DIAGNOSIS — G4733 Obstructive sleep apnea (adult) (pediatric): Secondary | ICD-10-CM | POA: Diagnosis not present

## 2020-02-29 ENCOUNTER — Other Ambulatory Visit: Payer: Self-pay | Admitting: Family Medicine

## 2020-02-29 DIAGNOSIS — N401 Enlarged prostate with lower urinary tract symptoms: Secondary | ICD-10-CM

## 2020-03-13 DIAGNOSIS — Z79899 Other long term (current) drug therapy: Secondary | ICD-10-CM | POA: Diagnosis not present

## 2020-03-13 DIAGNOSIS — M353 Polymyalgia rheumatica: Secondary | ICD-10-CM | POA: Diagnosis not present

## 2020-03-13 DIAGNOSIS — E559 Vitamin D deficiency, unspecified: Secondary | ICD-10-CM | POA: Diagnosis not present

## 2020-03-24 ENCOUNTER — Other Ambulatory Visit: Payer: Self-pay | Admitting: Family Medicine

## 2020-03-24 DIAGNOSIS — N401 Enlarged prostate with lower urinary tract symptoms: Secondary | ICD-10-CM

## 2020-03-24 DIAGNOSIS — R3911 Hesitancy of micturition: Secondary | ICD-10-CM

## 2020-04-11 DIAGNOSIS — E559 Vitamin D deficiency, unspecified: Secondary | ICD-10-CM | POA: Diagnosis not present

## 2020-04-11 DIAGNOSIS — R002 Palpitations: Secondary | ICD-10-CM | POA: Diagnosis not present

## 2020-04-11 DIAGNOSIS — R21 Rash and other nonspecific skin eruption: Secondary | ICD-10-CM | POA: Diagnosis not present

## 2020-04-11 DIAGNOSIS — M353 Polymyalgia rheumatica: Secondary | ICD-10-CM | POA: Diagnosis not present

## 2020-04-11 DIAGNOSIS — Z2820 Immunization not carried out because of patient decision for unspecified reason: Secondary | ICD-10-CM | POA: Diagnosis not present

## 2020-04-11 DIAGNOSIS — S81801A Unspecified open wound, right lower leg, initial encounter: Secondary | ICD-10-CM | POA: Diagnosis not present

## 2020-04-11 DIAGNOSIS — Z23 Encounter for immunization: Secondary | ICD-10-CM | POA: Diagnosis not present

## 2020-04-11 DIAGNOSIS — G4733 Obstructive sleep apnea (adult) (pediatric): Secondary | ICD-10-CM | POA: Diagnosis not present

## 2020-04-11 DIAGNOSIS — L0390 Cellulitis, unspecified: Secondary | ICD-10-CM | POA: Diagnosis not present

## 2020-04-11 DIAGNOSIS — Z0000 Encounter for general adult medical examination without abnormal findings: Secondary | ICD-10-CM | POA: Diagnosis not present

## 2020-04-27 DIAGNOSIS — L0390 Cellulitis, unspecified: Secondary | ICD-10-CM | POA: Diagnosis not present

## 2020-04-27 DIAGNOSIS — S81801D Unspecified open wound, right lower leg, subsequent encounter: Secondary | ICD-10-CM | POA: Diagnosis not present

## 2020-04-27 DIAGNOSIS — Z2882 Immunization not carried out because of caregiver refusal: Secondary | ICD-10-CM | POA: Diagnosis not present

## 2020-04-27 DIAGNOSIS — E559 Vitamin D deficiency, unspecified: Secondary | ICD-10-CM | POA: Diagnosis not present

## 2020-05-01 DIAGNOSIS — L308 Other specified dermatitis: Secondary | ICD-10-CM | POA: Diagnosis not present

## 2020-05-01 DIAGNOSIS — B358 Other dermatophytoses: Secondary | ICD-10-CM | POA: Diagnosis not present

## 2020-05-11 DIAGNOSIS — Z03818 Encounter for observation for suspected exposure to other biological agents ruled out: Secondary | ICD-10-CM | POA: Diagnosis not present

## 2020-05-12 DIAGNOSIS — Z03818 Encounter for observation for suspected exposure to other biological agents ruled out: Secondary | ICD-10-CM | POA: Diagnosis not present

## 2020-05-13 ENCOUNTER — Other Ambulatory Visit: Payer: Self-pay | Admitting: Primary Care

## 2020-05-13 DIAGNOSIS — U071 COVID-19: Secondary | ICD-10-CM

## 2020-05-13 NOTE — Progress Notes (Signed)
I connected by phone with Jeremy Cardenas on 05/13/2020 at 12:06 PM to discuss the potential use of a new treatment for mild to moderate COVID-19 viral infection in non-hospitalized patients.  This patient is a 74 y.o. male that meets the FDA criteria for Emergency Use Authorization of COVID monoclonal antibody casirivimab/imdevimab or bamlanivimab/eteseviamb.  Has a (+) direct SARS-CoV-2 viral test result  Has mild or moderate COVID-19   Is NOT hospitalized due to COVID-19  Is within 10 days of symptom onset  Has at least one of the high risk factor(s) for progression to severe COVID-19 and/or hospitalization as defined in EUA.  Specific high risk criteria : Older age (>/= 76 yo)   I have spoken and communicated the following to the patient or parent/caregiver regarding COVID monoclonal antibody treatment:  1. FDA has authorized the emergency use for the treatment of mild to moderate COVID-19 in adults and pediatric patients with positive results of direct SARS-CoV-2 viral testing who are 57 years of age and older weighing at least 40 kg, and who are at high risk for progressing to severe COVID-19 and/or hospitalization.  2. The significant known and potential risks and benefits of COVID monoclonal antibody, and the extent to which such potential risks and benefits are unknown.  3. Information on available alternative treatments and the risks and benefits of those alternatives, including clinical trials.  4. Patients treated with COVID monoclonal antibody should continue to self-isolate and use infection control measures (e.g., wear mask, isolate, social distance, avoid sharing personal items, clean and disinfect "high touch" surfaces, and frequent handwashing) according to CDC guidelines.   5. The patient or parent/caregiver has the option to accept or refuse COVID monoclonal antibody treatment.  After reviewing this information with the patient, the patient has agreed to receive one  of the available covid 19 monoclonal antibodies and will be provided an appropriate fact sheet prior to infusion. Pleas Koch, NP 05/13/2020 12:06 PM

## 2020-05-14 ENCOUNTER — Other Ambulatory Visit (HOSPITAL_COMMUNITY): Payer: Self-pay

## 2020-05-14 ENCOUNTER — Ambulatory Visit (HOSPITAL_COMMUNITY)
Admission: RE | Admit: 2020-05-14 | Discharge: 2020-05-14 | Disposition: A | Discharge status: Home / Self Care | Payer: Medicare Other | Source: Ambulatory Visit | Attending: Pulmonary Disease | Admitting: Pulmonary Disease

## 2020-05-14 DIAGNOSIS — Z23 Encounter for immunization: Secondary | ICD-10-CM | POA: Insufficient documentation

## 2020-05-14 DIAGNOSIS — U071 COVID-19: Secondary | ICD-10-CM | POA: Diagnosis present

## 2020-05-14 MED ORDER — FAMOTIDINE IN NACL 20-0.9 MG/50ML-% IV SOLN: 20.0000 mg | Freq: Once | INTRAVENOUS | Status: DC | PRN

## 2020-05-14 MED ORDER — SODIUM CHLORIDE 0.9 % IV SOLN: Freq: Once | INTRAVENOUS | Status: AC

## 2020-05-14 MED ORDER — METHYLPREDNISOLONE SODIUM SUCC 125 MG IJ SOLR: 125.0000 mg | Freq: Once | INTRAMUSCULAR | Status: DC | PRN

## 2020-05-14 MED ORDER — ALBUTEROL SULFATE HFA 108 (90 BASE) MCG/ACT IN AERS: 2.0000 | INHALATION_SPRAY | Freq: Once | RESPIRATORY_TRACT | Status: DC | PRN

## 2020-05-14 MED ORDER — EPINEPHRINE 0.3 MG/0.3ML IJ SOAJ: 0.3000 mg | Freq: Once | INTRAMUSCULAR | Status: DC | PRN

## 2020-05-14 MED ORDER — SODIUM CHLORIDE 0.9 % IV SOLN: INTRAVENOUS | Status: DC | PRN

## 2020-05-14 MED ORDER — DIPHENHYDRAMINE HCL 50 MG/ML IJ SOLN: 50.0000 mg | Freq: Once | INTRAMUSCULAR | Status: DC | PRN

## 2020-05-14 NOTE — Progress Notes (Signed)
  Diagnosis: COVID-19  Physician:Dr Joya Gaskins   Procedure: [facility] Med: bamlanivimab\etesevimab infusion - Provided patient with bamlanimivab\etesevimab fact sheet for patients, parents and caregivers prior to infusion.  Complications: No immediate complications noted.  Discharge: Discharged home   Jeremy Cardenas 05/14/2020

## 2020-05-14 NOTE — Discharge Instructions (Signed)

## 2020-05-17 ENCOUNTER — Emergency Department (HOSPITAL_BASED_OUTPATIENT_CLINIC_OR_DEPARTMENT_OTHER): Payer: Medicare HMO

## 2020-05-17 ENCOUNTER — Emergency Department (HOSPITAL_BASED_OUTPATIENT_CLINIC_OR_DEPARTMENT_OTHER)
Admission: EM | Admit: 2020-05-17 | Discharge: 2020-05-17 | Disposition: A | Discharge status: Home / Self Care | Payer: Medicare HMO | Attending: Emergency Medicine | Admitting: Emergency Medicine

## 2020-05-17 ENCOUNTER — Other Ambulatory Visit: Payer: Self-pay

## 2020-05-17 ENCOUNTER — Encounter (HOSPITAL_BASED_OUTPATIENT_CLINIC_OR_DEPARTMENT_OTHER): Payer: Self-pay | Admitting: *Deleted

## 2020-05-17 DIAGNOSIS — E039 Hypothyroidism, unspecified: Secondary | ICD-10-CM | POA: Insufficient documentation

## 2020-05-17 DIAGNOSIS — Z86010 Personal history of colonic polyps: Secondary | ICD-10-CM | POA: Diagnosis not present

## 2020-05-17 DIAGNOSIS — Z87891 Personal history of nicotine dependence: Secondary | ICD-10-CM | POA: Insufficient documentation

## 2020-05-17 DIAGNOSIS — Z85820 Personal history of malignant melanoma of skin: Secondary | ICD-10-CM | POA: Insufficient documentation

## 2020-05-17 DIAGNOSIS — Z79899 Other long term (current) drug therapy: Secondary | ICD-10-CM | POA: Insufficient documentation

## 2020-05-17 DIAGNOSIS — Z7982 Long term (current) use of aspirin: Secondary | ICD-10-CM | POA: Insufficient documentation

## 2020-05-17 DIAGNOSIS — R059 Cough, unspecified: Secondary | ICD-10-CM

## 2020-05-17 DIAGNOSIS — U071 COVID-19: Secondary | ICD-10-CM

## 2020-05-17 HISTORY — DX: COVID-19: U07.1

## 2020-05-17 MED ORDER — BENZONATATE 100 MG PO CAPS: 100.0000 mg | ORAL_CAPSULE | Freq: Two times a day (BID) | ORAL | 0 refills | Status: AC

## 2020-05-17 NOTE — ED Triage Notes (Signed)
C/o increase cough x 1 day , covid positive x 1 week , I" infusion" x 4 days ago

## 2020-05-17 NOTE — ED Notes (Signed)
ED Provider at bedside. 

## 2020-05-17 NOTE — ED Provider Notes (Signed)
[facility] EMERGENCY DEPARTMENT Provider Note   CSN: [id number] Arrival date & time: 05/17/20  1302     History Chief Complaint  Patient presents with  . Covid Positive    Jeremy Cardenas is a 74 y.o. male.   Cough Cough characteristics:  Productive Sputum characteristics:  Yellow Severity:  Moderate Onset quality:  Gradual Timing:  Constant Progression:  Waxing and waning Chronicity:  New Context: upper respiratory infection   Context comment:  Covid positive Relieved by:  Nothing Worsened by:  Nothing Ineffective treatments:  None tried Associated symptoms: no chest pain, no chills, no fever, no headaches, no rash, no rhinorrhea and no shortness of breath        Past Medical History:  Diagnosis Date  . Colon polyps   . COVID-19   . Hearing loss   . Hyperlipidemia   . Hypothyroidism   . Internal hemorrhoids   . Leiomyoma   . Sigmoid diverticulosis   . Sleep apnea     Patient Active Problem List   Diagnosis Date Noted  . Primary osteoarthritis of right knee 09/15/2018  . Xerosis cutis 09/15/2018  . Benign prostatic hyperplasia with urinary hesitancy 06/11/2017  . Hearing loss associated with syndrome, bilateral 06/11/2017  . Routine general medical examination at a health care facility 05/25/2013  . ERECTILE DYSFUNCTION, ORGANIC 05/28/2010  . CLOSED DISLOCATION OF ACROMIOCLAVICULAR 10/20/2007  . ALLERGIC RHINITIS 06/11/2007  . Obstructive sleep apnea 05/28/2007    Past Surgical History:  Procedure Laterality Date  . ANKLE FUSION    . COLONOSCOPY W/ BIOPSIES    . INGUINAL HERNIA REPAIR    . rotator cuff surgery         Family History  Problem Relation Age of Onset  . Colon cancer Other   . Diabetes Other     Social History   Tobacco Use  . Smoking status: Former Smoker    Packs/day: 1.00    Years: 20.00    Pack years: 20.00    Types: Cigarettes    Quit date: 07/29/1981    Years since quitting: 38.8  . Smokeless tobacco:  Never Used  Substance Use Topics  . Alcohol use: Yes    Comment: wine  . Drug use: No    Home Medications Prior to Admission medications   Medication Sig Start Date End Date Taking? Authorizing Provider  aspirin 81 MG tablet Take 81 mg by mouth daily.    [provider]  benzonatate (TESSALON) 100 MG capsule Take 1 capsule (100 mg total) by mouth 2 (two) times daily for 10 days. 05/17/20 05/27/20  Breck Coons, MD  co-enzyme Q-10 30 MG capsule Take 30 mg by mouth daily.    [provider]  diclofenac sodium (VOLTAREN) 1 % GEL Apply a small amount to the sore space on the knee 3 times a day as needed. Patient not taking: Reported on 02/25/2019 09/18/18   Libby Maw, MD  fluticasone [facility]) 50 MCG/ACT nasal spray Place 1 spray into both nostrils as needed.     [provider]  folic acid (FOLVITE) 1 MG tablet Take 1 mg by mouth daily. 01/15/19   [provider]  glucosamine-chondroitin 500-400 MG tablet Take 1 tablet by mouth 3 (three) times daily.    [provider]  Magnesium 400 MG TABS Take 1 tablet by mouth daily.    [provider]  methotrexate (RHEUMATREX) 2.5 MG tablet Take 1 tablet by mouth. 3 times a week  02/07/19   [provider]  Omega-3 Fatty Acids (SALMON OIL-1000) 200 MG CAPS Take by mouth daily.    [provider]  OVER THE COUNTER MEDICATION 600 mg daily. 2 pills daily    [provider]  predniSONE (DELTASONE) 10 MG tablet Take 10 mg by mouth daily. 02/07/19   [provider]  saw palmetto 160 MG capsule Take 160 mg by mouth 2 (two) times daily.    [provider]  sildenafil (REVATIO) 20 MG tablet Take 1 to 2 tabs 2 - 3 hours before sex 06/11/17   Dorena Cookey, MD  tamsulosin (FLOMAX) 0.4 MG CAPS capsule TAKE 1 CAPSULE BY MOUTH EVERY DAY 12/08/19   Libby Maw, MD  Turmeric POWD by Does not apply route.    [provider]  Vitamin D,  Ergocalciferol, (DRISDOL) 1.25 MG (50000 UT) CAPS capsule Take 1 capsule by mouth once a week. 01/01/19   [provider]    Allergies    Amoxicillin  Review of Systems   Review of Systems  Constitutional: Negative for chills and fever.  HENT: Negative for congestion and rhinorrhea.   Respiratory: Positive for cough. Negative for shortness of breath.   Cardiovascular: Negative for chest pain and palpitations.  Gastrointestinal: Negative for diarrhea, nausea and vomiting.  Genitourinary: Negative for difficulty urinating and dysuria.  Musculoskeletal: Negative for arthralgias and back pain.  Skin: Negative for color change and rash.  Neurological: Negative for light-headedness and headaches.    Physical Exam Updated Vital Signs BP 119/68 (BP Location: Right Arm)   Pulse 76   Temp 98.4 F (36.9 C) (Oral)   Resp 18   Ht 5\' 9"  (1.753 m)   Wt 90.7 kg   SpO2 98%   BMI 29.53 kg/m   Physical Exam Vitals and nursing note reviewed.  Constitutional:      General: He is not in acute distress.    Appearance: Normal appearance.  HENT:     Head: Normocephalic and atraumatic.     Nose: No rhinorrhea.  Eyes:     General:        Right eye: No discharge.        Left eye: No discharge.     Conjunctiva/sclera: Conjunctivae normal.  Cardiovascular:     Rate and Rhythm: Normal rate and regular rhythm.  Pulmonary:     Effort: Pulmonary effort is normal. No respiratory distress.     Breath sounds: No stridor. No wheezing or rales.     Comments: Transmitted upper airway sounds Abdominal:     General: Abdomen is flat. There is no distension.     Palpations: Abdomen is soft.  Musculoskeletal:        General: No deformity or signs of injury.  Skin:    General: Skin is warm and dry.  Neurological:     General: No focal deficit present.     Mental Status: He is alert. Mental status is at baseline.     Motor: No weakness.  Psychiatric:        Mood and Affect: Mood normal.         Behavior: Behavior normal.        Thought Content: Thought content normal.     ED Results / Procedures / Treatments   Labs (all labs ordered are listed, but only abnormal results are displayed) Labs Reviewed - No data to display  EKG None  Radiology DG Chest Portable 1 View  Result Date: 05/17/2020  CLINICAL DATA:  Cough, COVID positive EXAM: PORTABLE CHEST 1 VIEW COMPARISON:  01/15/2008 FINDINGS: Lungs are well expanded, symmetric, and clear. No pneumothorax or pleural effusion. Cardiac size within normal limits. Pulmonary vascularity is normal. Osseous structures are age-appropriate. No acute bone abnormality. IMPRESSION: No active disease. Electronically Signed   By: Fidela Salisbury MD   On: 05/17/2020 13:44    Procedures Procedures (including critical care time)  Medications Ordered in ED Medications - No data to display  ED Course  I have reviewed the triage vital signs and the nursing notes.  Pertinent labs & imaging results that were available during my care of the patient were reviewed by me and considered in my medical decision making (see chart for details).    MDM Rules/Calculators/A&P                          Patient has been Covid positive for 1 week.  Worsening cough.  Concerned he has pneumonia.  No fever.  Vital signs stable afebrile clear lung sounds with transmitted upper airway sounds.  Nondescript mildly productive cough.  No chest pain no shortness of breath eating and drinking well.  Chest x-ray reviewed by radiology myself shows no acute cardiopulmonary pathology, no infiltrate no signs of pneumonia.  Over-the-counter cough suppression honey and Tessalon Perles recommended.  Outpatient follow-up recommended strict return precautions provided. Final Clinical Impression(s) / ED Diagnoses Final diagnoses:  COVID  Cough    Rx / DC Orders ED Discharge Orders         Ordered    benzonatate (TESSALON) 100 MG capsule  2 times daily        05/17/20 1352             Breck Coons, MD 05/17/20 1453

## 2020-06-01 DIAGNOSIS — H9313 Tinnitus, bilateral: Secondary | ICD-10-CM | POA: Diagnosis not present

## 2020-06-01 DIAGNOSIS — M353 Polymyalgia rheumatica: Secondary | ICD-10-CM | POA: Diagnosis not present

## 2020-06-01 DIAGNOSIS — H903 Sensorineural hearing loss, bilateral: Secondary | ICD-10-CM | POA: Diagnosis not present

## 2020-06-01 DIAGNOSIS — R059 Cough, unspecified: Secondary | ICD-10-CM | POA: Diagnosis not present

## 2020-06-03 DIAGNOSIS — J069 Acute upper respiratory infection, unspecified: Secondary | ICD-10-CM | POA: Diagnosis not present

## 2020-06-03 DIAGNOSIS — R059 Cough, unspecified: Secondary | ICD-10-CM | POA: Diagnosis not present

## 2020-06-14 DIAGNOSIS — E559 Vitamin D deficiency, unspecified: Secondary | ICD-10-CM | POA: Diagnosis not present

## 2020-06-14 DIAGNOSIS — Z79899 Other long term (current) drug therapy: Secondary | ICD-10-CM | POA: Diagnosis not present

## 2020-06-14 DIAGNOSIS — M353 Polymyalgia rheumatica: Secondary | ICD-10-CM | POA: Diagnosis not present

## 2020-09-10 IMAGING — DX DG KNEE COMPLETE 4+V*R*
4 series · 4 of 4 positions shown · non-contrast
Comparison: None.

CLINICAL DATA: Medial right knee pain since the patient tripped 3
weeks ago. The patient did not fall. Primary osteoarthritis of the
right knee.

EXAM:
RIGHT KNEE - COMPLETE 4+ VIEW

[knee ap]
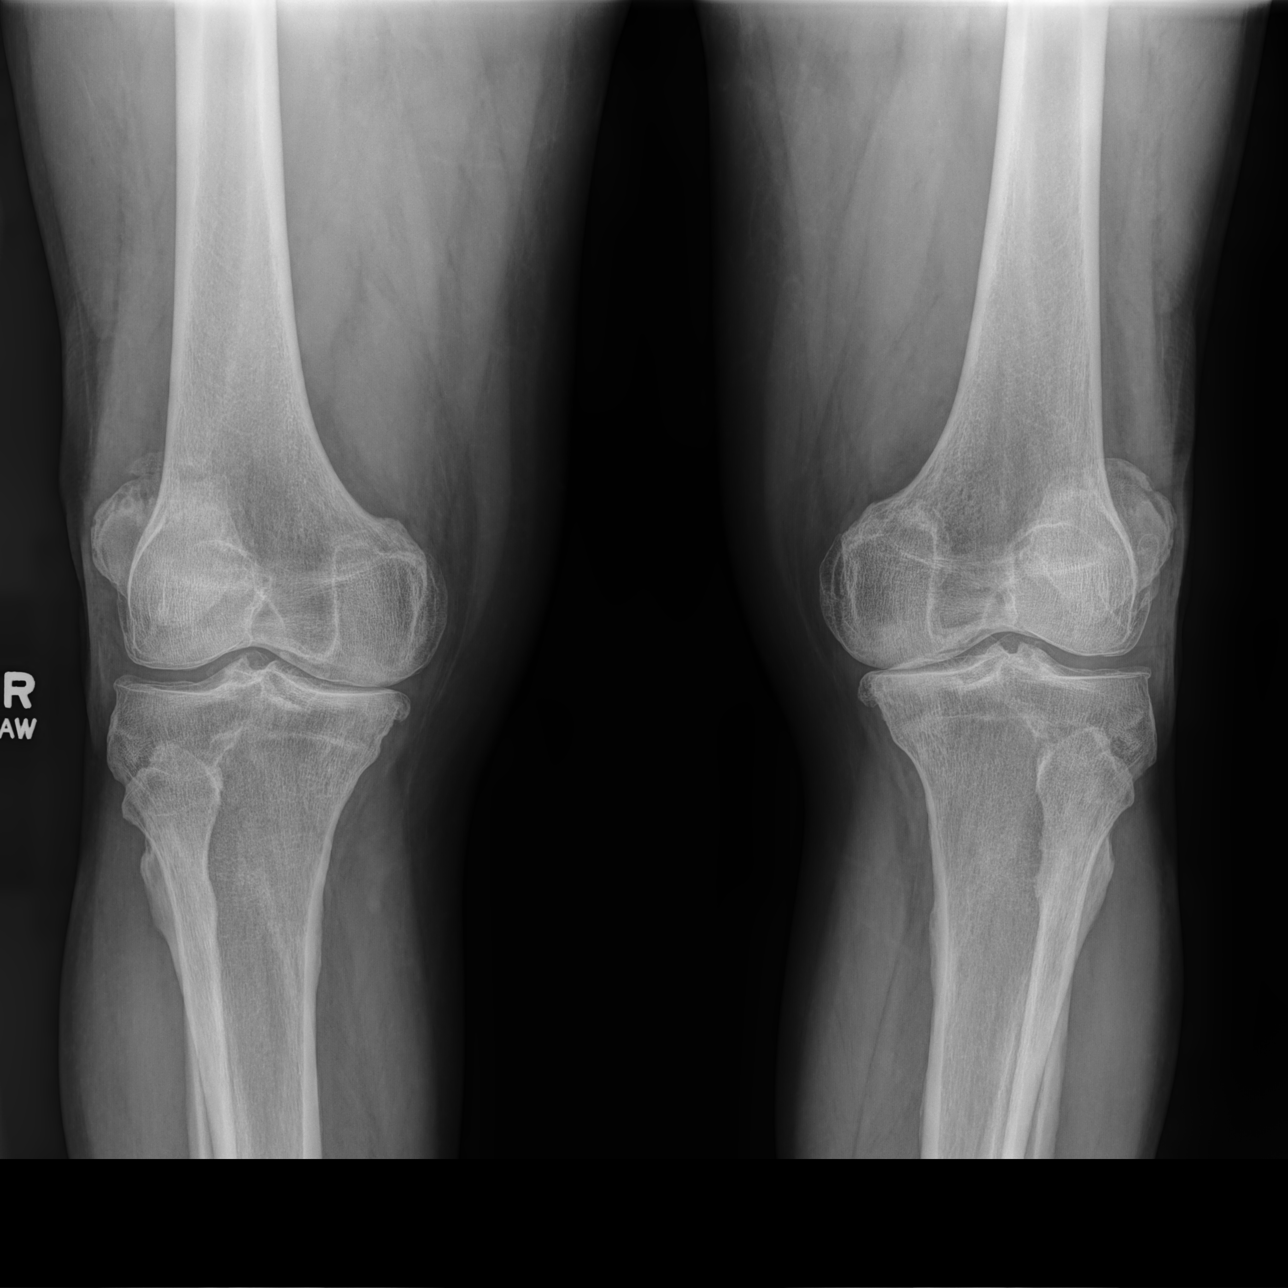

[knee [person_name] view pa]
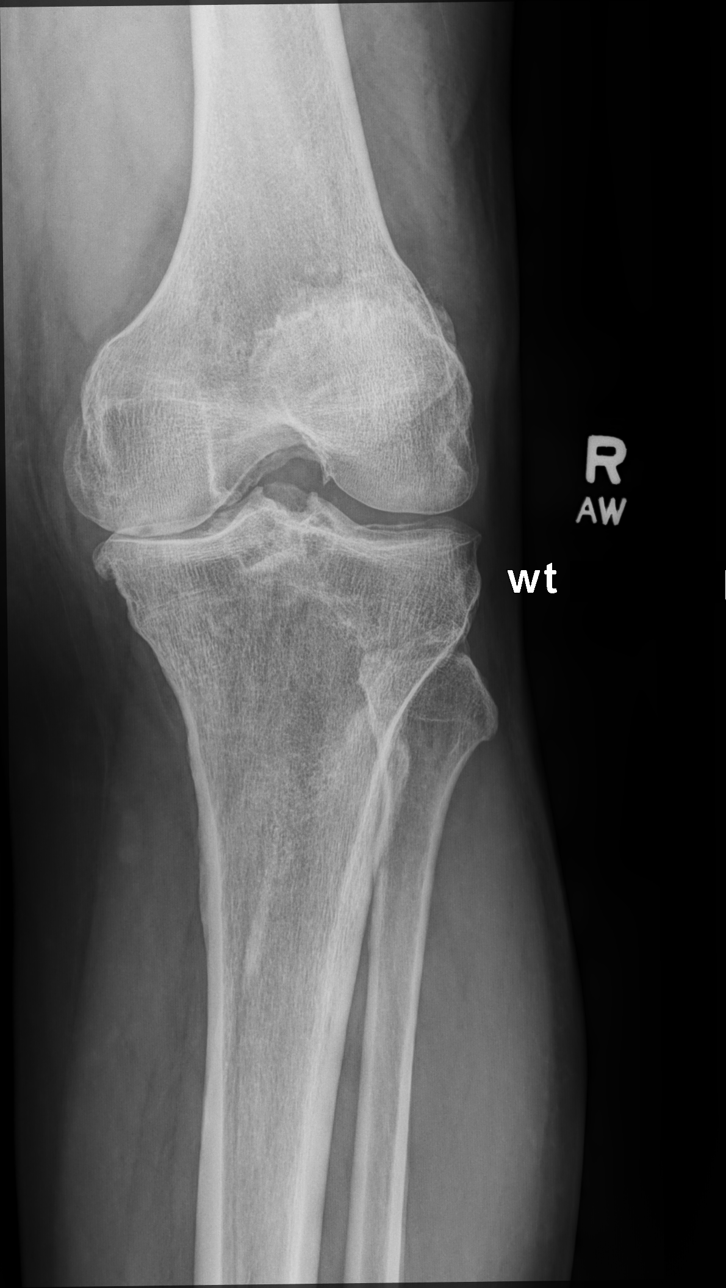

[knee lat]
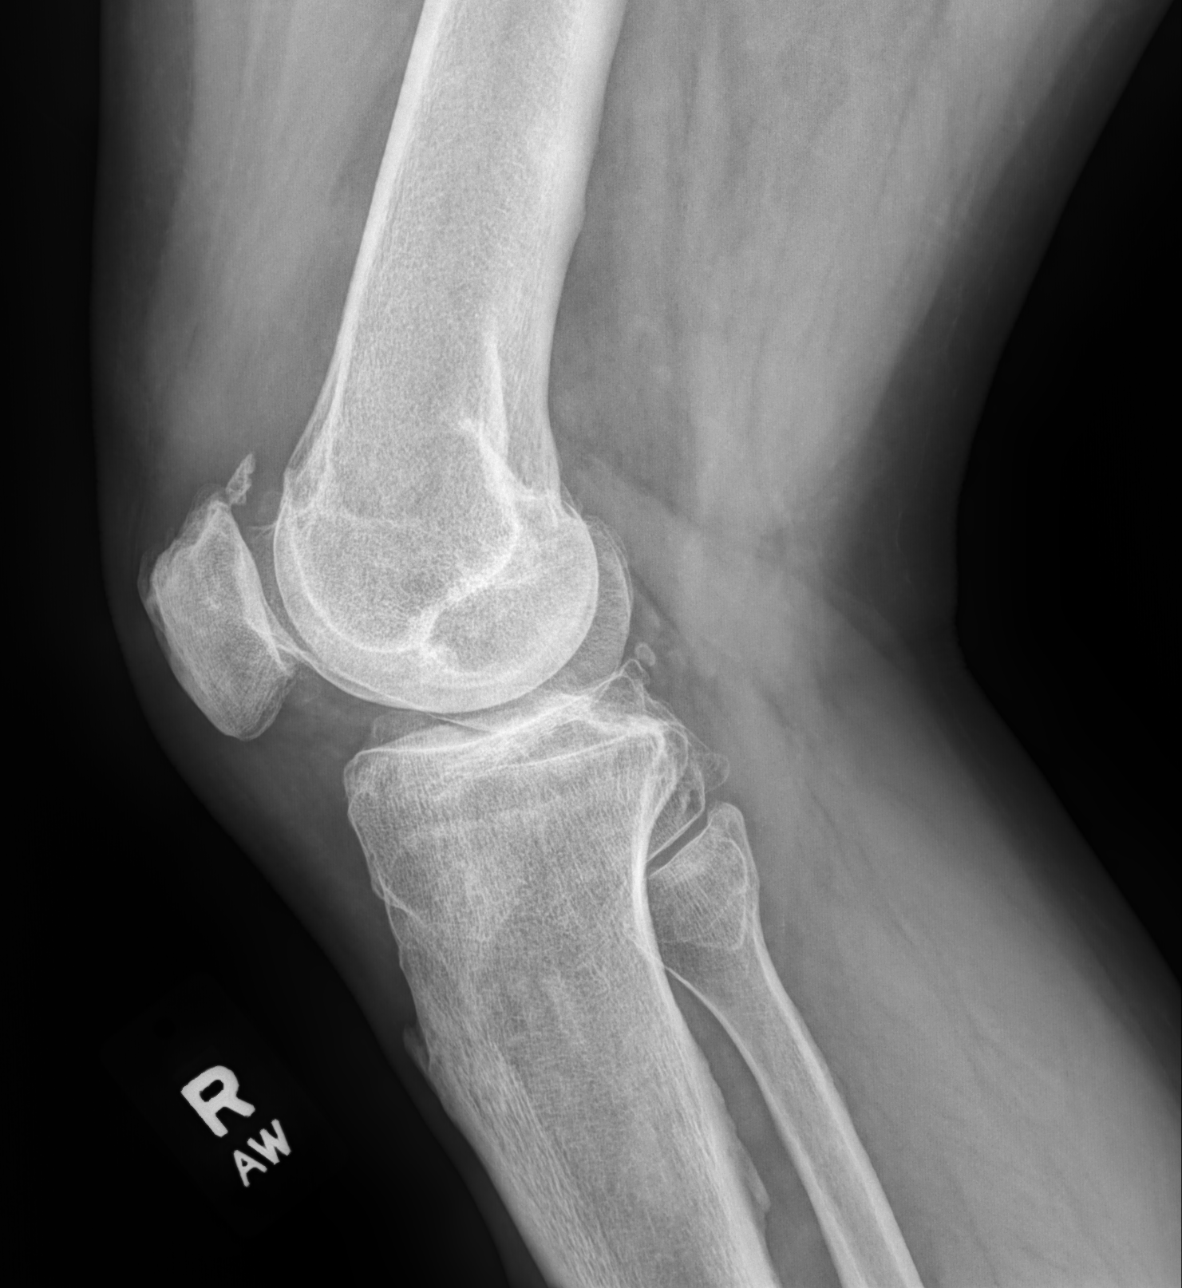

[patella (sunrise)]
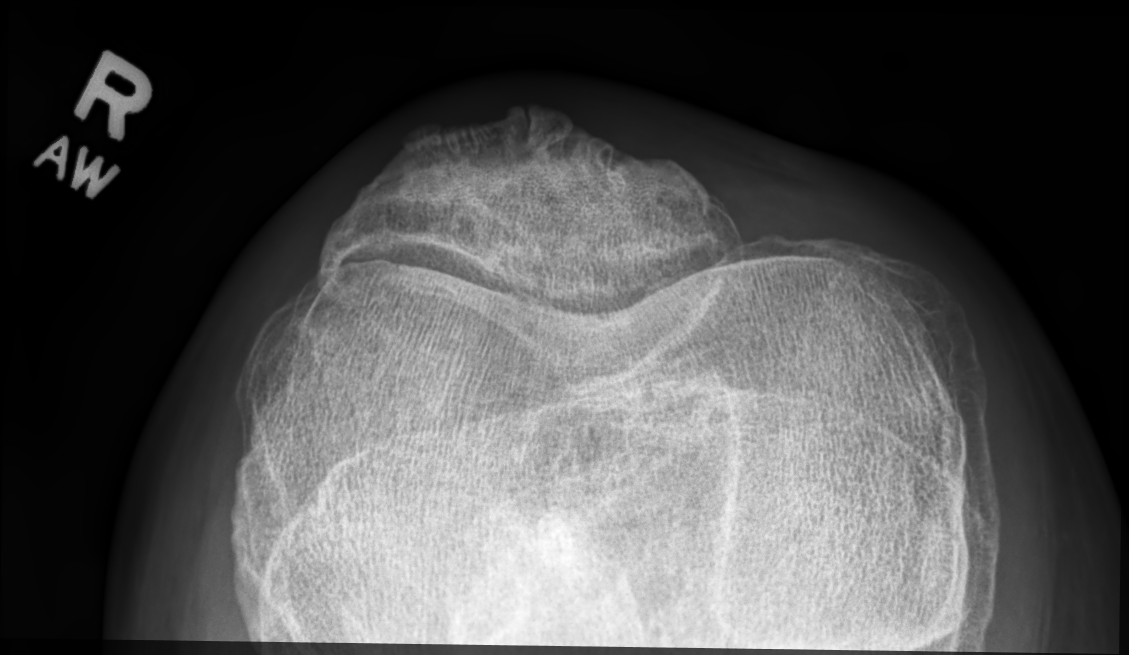

[4 of 4 positions shown; findings below may reference images not displayed]

FINDINGS: There is marked medial joint space narrowing with medial and
patellofemoral osteophyte formation. Small joint effusion.

No fracture or dislocation.
IMPRESSION: No acute bone abnormality. Osteoarthritis of the right knee, most
severe in the medial compartment. Small joint effusion.

There are similar findings visible on the AP view of the left knee.

## 2020-09-14 DIAGNOSIS — M1711 Unilateral primary osteoarthritis, right knee: Secondary | ICD-10-CM | POA: Diagnosis not present

## 2020-09-14 DIAGNOSIS — E559 Vitamin D deficiency, unspecified: Secondary | ICD-10-CM | POA: Diagnosis not present

## 2020-09-14 DIAGNOSIS — M25461 Effusion, right knee: Secondary | ICD-10-CM | POA: Diagnosis not present

## 2020-09-14 DIAGNOSIS — Z79899 Other long term (current) drug therapy: Secondary | ICD-10-CM | POA: Diagnosis not present

## 2020-09-14 DIAGNOSIS — R7982 Elevated C-reactive protein (CRP): Secondary | ICD-10-CM | POA: Diagnosis not present

## 2020-09-14 DIAGNOSIS — M353 Polymyalgia rheumatica: Secondary | ICD-10-CM | POA: Diagnosis not present

## 2020-11-07 DIAGNOSIS — B370 Candidal stomatitis: Secondary | ICD-10-CM | POA: Diagnosis not present

## 2020-12-04 DIAGNOSIS — J0100 Acute maxillary sinusitis, unspecified: Secondary | ICD-10-CM | POA: Diagnosis not present

## 2020-12-04 DIAGNOSIS — Z20828 Contact with and (suspected) exposure to other viral communicable diseases: Secondary | ICD-10-CM | POA: Diagnosis not present

## 2020-12-04 DIAGNOSIS — J309 Allergic rhinitis, unspecified: Secondary | ICD-10-CM | POA: Diagnosis not present

## 2020-12-12 DIAGNOSIS — G4733 Obstructive sleep apnea (adult) (pediatric): Secondary | ICD-10-CM | POA: Diagnosis not present

## 2020-12-14 DIAGNOSIS — E559 Vitamin D deficiency, unspecified: Secondary | ICD-10-CM | POA: Diagnosis not present

## 2020-12-14 DIAGNOSIS — M353 Polymyalgia rheumatica: Secondary | ICD-10-CM | POA: Diagnosis not present

## 2020-12-14 DIAGNOSIS — M1711 Unilateral primary osteoarthritis, right knee: Secondary | ICD-10-CM | POA: Diagnosis not present

## 2020-12-14 DIAGNOSIS — Z79899 Other long term (current) drug therapy: Secondary | ICD-10-CM | POA: Diagnosis not present

## 2021-01-03 ENCOUNTER — Other Ambulatory Visit: Payer: Self-pay

## 2021-01-03 ENCOUNTER — Emergency Department (HOSPITAL_BASED_OUTPATIENT_CLINIC_OR_DEPARTMENT_OTHER): Payer: Medicare HMO

## 2021-01-03 ENCOUNTER — Emergency Department (HOSPITAL_BASED_OUTPATIENT_CLINIC_OR_DEPARTMENT_OTHER)
Admission: EM | Admit: 2021-01-03 | Discharge: 2021-01-03 | Disposition: A | Discharge status: Home / Self Care | Payer: Medicare HMO | Attending: Emergency Medicine | Admitting: Emergency Medicine

## 2021-01-03 ENCOUNTER — Encounter (HOSPITAL_BASED_OUTPATIENT_CLINIC_OR_DEPARTMENT_OTHER): Payer: Self-pay

## 2021-01-03 DIAGNOSIS — R001 Bradycardia, unspecified: Secondary | ICD-10-CM | POA: Insufficient documentation

## 2021-01-03 DIAGNOSIS — R5383 Other fatigue: Secondary | ICD-10-CM | POA: Diagnosis not present

## 2021-01-03 DIAGNOSIS — Z7982 Long term (current) use of aspirin: Secondary | ICD-10-CM | POA: Insufficient documentation

## 2021-01-03 DIAGNOSIS — R000 Tachycardia, unspecified: Secondary | ICD-10-CM | POA: Diagnosis not present

## 2021-01-03 DIAGNOSIS — Z87891 Personal history of nicotine dependence: Secondary | ICD-10-CM | POA: Diagnosis not present

## 2021-01-03 DIAGNOSIS — Z8616 Personal history of COVID-19: Secondary | ICD-10-CM | POA: Insufficient documentation

## 2021-01-03 DIAGNOSIS — R002 Palpitations: Secondary | ICD-10-CM | POA: Insufficient documentation

## 2021-01-03 DIAGNOSIS — E039 Hypothyroidism, unspecified: Secondary | ICD-10-CM | POA: Insufficient documentation

## 2021-01-03 LAB — CBC
HCT: 42.9 % (ref 39.0–52.0)
Hemoglobin: 14.5 g/dL (ref 13.0–17.0)
MCH: 32.9 pg (ref 26.0–34.0)
MCHC: 33.8 g/dL (ref 30.0–36.0)
MCV: 97.3 fL (ref 80.0–100.0)
Platelets: 170 10*3/uL (ref 150–400)
RBC: 4.41 MIL/uL (ref 4.22–5.81)
RDW: 15.1 % (ref 11.5–15.5)
WBC: 5.8 10*3/uL (ref 4.0–10.5)
nRBC: 0 % (ref 0.0–0.2)

## 2021-01-03 LAB — BASIC METABOLIC PANEL
Anion gap: 5 (ref 5–15)
BUN: 18 mg/dL (ref 8–23)
CO2: 25 mmol/L (ref 22–32)
Calcium: 9 mg/dL (ref 8.9–10.3)
Chloride: 105 mmol/L (ref 98–111)
Creatinine, Ser: 0.77 mg/dL (ref 0.61–1.24)
GFR, Estimated: >60 mL/min (ref >60)
Glucose, Bld: 148 mg/dL — ABNORMAL HIGH (ref 70–99)
Potassium: 4 mmol/L (ref 3.5–5.1)
Sodium: 135 mmol/L (ref 135–145)

## 2021-01-03 LAB — TROPONIN I (HIGH SENSITIVITY)
Troponin I (High Sensitivity): 4 ng/L (ref <18)
Troponin I (High Sensitivity): 4 ng/L (ref <18)

## 2021-01-03 NOTE — ED Triage Notes (Addendum)
Pt reports feeling like his heart was beating fast for ~30 mins last night-denies pain-states he has been experinecing fatigue-recent viral URI with zpack and neg covid test-NAD-steady gait

## 2021-01-03 NOTE — ED Notes (Signed)
ED Provider at bedside. 

## 2021-01-03 NOTE — ED Provider Notes (Signed)
[facility] EMERGENCY DEPARTMENT Provider Note   CSN: [id number] Arrival date & time: 01/03/21  1118     History Chief Complaint  Patient presents with  . Tachycardia    Jeremy Cardenas is a 75 y.o. male.  Patient with history of hyperlipidemia and hypothyroidism, polymyalgia rheumatica on methotrexate and prednisone -- presents the emergency department for a tachycardic spell occurring last night around the time that he went to bed.  Patient reported approximately 30 minutes of tachypalpitations.  These were first noticed by his wife who has a personal history of atrial fibrillation.  Symptoms resolved.  He did not have associated chest pain, shortness of breath, lightheadedness.  He reports chopping wood yesterday without any difficulties.  Although, he states that over the past couple of months he has felt more fatigued than normal.  Last physical exam was September 2021.  He denies any other infectious symptoms.  He is currently in his baseline state of health and denies symptoms. The onset of this condition was acute. The course is resolved. Aggravating factors: none. Alleviating factors: none.  He has never had a spell like this in the past.         Past Medical History:  Diagnosis Date  . Colon polyps   . COVID-19   . Hearing loss   . Hyperlipidemia   . Hypothyroidism   . Internal hemorrhoids   . Leiomyoma   . Sigmoid diverticulosis   . Sleep apnea     Patient Active Problem List   Diagnosis Date Noted  . Primary osteoarthritis of right knee 09/15/2018  . Xerosis cutis 09/15/2018  . Benign prostatic hyperplasia with urinary hesitancy 06/11/2017  . Hearing loss associated with syndrome, bilateral 06/11/2017  . Routine general medical examination at a health care facility 05/25/2013  . ERECTILE DYSFUNCTION, ORGANIC 05/28/2010  . CLOSED DISLOCATION OF ACROMIOCLAVICULAR 10/20/2007  . ALLERGIC RHINITIS 06/11/2007  . Obstructive sleep apnea 05/28/2007     Past Surgical History:  Procedure Laterality Date  . ANKLE FUSION    . COLONOSCOPY W/ BIOPSIES    . INGUINAL HERNIA REPAIR    . rotator cuff surgery         Family History  Problem Relation Age of Onset  . Colon cancer Other   . Diabetes Other     Social History   Tobacco Use  . Smoking status: Former Smoker    Packs/day: 1.00    Years: 20.00    Pack years: 20.00    Types: Cigarettes    Quit date: 07/29/1981    Years since quitting: 39.4  . Smokeless tobacco: Never Used  Substance Use Topics  . Alcohol use: Yes    Comment: occ  . Drug use: No    Home Medications Prior to Admission medications   Medication Sig Start Date End Date Taking? Authorizing Provider  aspirin 81 MG tablet Take 81 mg by mouth daily.    [provider]  co-enzyme Q-10 30 MG capsule Take 30 mg by mouth daily.    [provider]  diclofenac sodium (VOLTAREN) 1 % GEL Apply a small amount to the sore space on the knee 3 times a day as needed. Patient not taking: Reported on 02/25/2019 09/18/18   Libby Maw, MD  fluticasone [facility]) 50 MCG/ACT nasal spray Place 1 spray into both nostrils as needed.     [provider]  folic acid (FOLVITE) 1 MG tablet Take 1 mg by mouth daily. 01/15/19  [provider]  glucosamine-chondroitin 500-400 MG tablet Take 1 tablet by mouth 3 (three) times daily.    [provider]  Magnesium 400 MG TABS Take 1 tablet by mouth daily.    [provider]  methotrexate (RHEUMATREX) 2.5 MG tablet Take 1 tablet by mouth. 3 times a week 02/07/19   [provider]  Omega-3 Fatty Acids (SALMON OIL-1000) 200 MG CAPS Take by mouth daily.    [provider]  OVER THE COUNTER MEDICATION 600 mg daily. 2 pills daily    [provider]  predniSONE (DELTASONE) 10 MG tablet Take 10 mg by mouth daily. 02/07/19   [provider]  saw palmetto 160 MG capsule Take 160 mg by mouth 2 (two) times  daily.    [provider]  sildenafil (REVATIO) 20 MG tablet Take 1 to 2 tabs 2 - 3 hours before sex 06/11/17   Dorena Cookey, MD  tamsulosin (FLOMAX) 0.4 MG CAPS capsule TAKE 1 CAPSULE BY MOUTH EVERY DAY 12/08/19   Libby Maw, MD  Turmeric POWD by Does not apply route.    [provider]  Vitamin D, Ergocalciferol, (DRISDOL) 1.25 MG (50000 UT) CAPS capsule Take 1 capsule by mouth once a week. 01/01/19   [provider]    Allergies    Amoxicillin  Review of Systems   Review of Systems  Constitutional: Negative for diaphoresis and fever.  Eyes: Negative for redness.  Respiratory: Negative for cough and shortness of breath.   Cardiovascular: Positive for palpitations. Negative for chest pain and leg swelling.  Gastrointestinal: Negative for abdominal pain, nausea and vomiting.  Genitourinary: Negative for dysuria.  Musculoskeletal: Negative for back pain and neck pain.  Skin: Negative for rash.  Neurological: Negative for syncope and light-headedness.  Psychiatric/Behavioral: The patient is not nervous/anxious.     Physical Exam Updated Vital Signs BP 128/63 (BP Location: Right Arm)   Pulse (!) 50   Temp 98.3 F (36.8 C) (Oral)   Resp (!) 21   Ht 5\' 9"  (1.753 m)   Wt 95.7 kg   SpO2 99%   BMI 31.16 kg/m   Physical Exam Vitals and nursing note reviewed.  Constitutional:      Appearance: He is well-developed.  HENT:     Head: Normocephalic and atraumatic.  Eyes:     General:        Right eye: No discharge.        Left eye: No discharge.     Conjunctiva/sclera: Conjunctivae normal.  Cardiovascular:     Rate and Rhythm: Regular rhythm. Bradycardia present.     Heart sounds: Normal heart sounds.  Pulmonary:     Effort: Pulmonary effort is normal.     Breath sounds: Normal breath sounds.  Abdominal:     Palpations: Abdomen is soft.     Tenderness: There is no abdominal tenderness.  Musculoskeletal:     Cervical back: Normal range  of motion and neck supple.  Skin:    General: Skin is warm and dry.  Neurological:     Mental Status: He is alert.     ED Results / Procedures / Treatments   Labs (all labs ordered are listed, but only abnormal results are displayed) Labs Reviewed  BASIC METABOLIC PANEL - Abnormal; Notable for the following components:      Result Value   Glucose, Bld 148 (*)    All other components within normal limits  CBC  TROPONIN I (HIGH SENSITIVITY)  TROPONIN I (HIGH SENSITIVITY)    EKG EKG Interpretation  Date/Time:  Wednesday January 03 2021 11:30:11 EDT Ventricular Rate:  57 PR Interval:  156 QRS Duration: 86 QT Interval:  416 QTC Calculation: 404 R Axis:   40 Text Interpretation: Sinus bradycardia Anterior infarct , age undetermined Abnormal ECG No prior ECG for comparison. No STEMI Confirmed by Antony Blackbird [phone]) on 01/03/2021 1:31:36 PM   Radiology DG Chest 2 View  Result Date: 01/03/2021 CLINICAL DATA:  Tachycardia EXAM: CHEST - 2 VIEW COMPARISON:  05/17/2020 FINDINGS: The heart size and mediastinal contours are within normal limits. Both lungs are clear. No pleural effusion. The visualized skeletal structures are unremarkable. IMPRESSION: No active cardiopulmonary disease. Electronically Signed   By: Macy Mis M.D.   On: 01/03/2021 12:23    Procedures Procedures   Medications Ordered in ED Medications - No data to display  ED Course  I have reviewed the triage vital signs and the nursing notes.  Pertinent labs & imaging results that were available during my care of the patient were reviewed by me and considered in my medical decision making (see chart for details).  Patient seen and examined.  Reviewed labs with patient at bedside.  His work-up including EKG is significant for only sinus bradycardia.  I reviewed telemetry which shows heart rates into the 40s but mainly in the 50s.  Vital signs reviewed and are as follows: BP 123/68   Pulse (!) 45   Temp 98.3 F  (36.8 C) (Oral)   Resp (!) 21   Ht 5\' 9"  (1.753 m)   Wt 95.7 kg   SpO2 100%   BMI 31.16 kg/m   It is unclear what the spell was last night, however patient will need to follow-up with his primary care doctor or see cardiology for consideration of an event monitor or longer-term EKG monitoring.  We did discuss that if he developed palpitations, especially with symptoms such as chest pain, shortness of breath, lightheadedness --he should return to the emergency department for further evaluation.  He verbalizes understanding agrees with plan.    MDM Rules/Calculators/A&P                          Patient with tachypalpitations last night, resolved.  Work-up here is unrevealing.  Follow-up recommended as above.   Final Clinical Impression(s) / ED Diagnoses Final diagnoses:  Palpitations    Rx / DC Orders ED Discharge Orders    None       Carlisle Cater, PA-C 01/03/21 1442    Tegeler, Gwenyth Allegra, MD 01/03/21 [phone]

## 2021-01-03 NOTE — Discharge Instructions (Signed)
Please read and follow all provided instructions.  Your diagnoses today include:  1. Palpitations     Tests performed today include:  An EKG of your heart  A chest x-ray  Cardiac enzymes - a blood test for heart muscle damage  Blood counts and electrolytes  Vital signs. See below for your results today.   Medications prescribed:   None  Take any prescribed medications only as directed.  Follow-up instructions: Please follow-up with your primary care provider or the cardiologist listed for further evaluation of your symptoms.   Return instructions:  SEEK IMMEDIATE MEDICAL ATTENTION IF:  You have severe chest pain, especially if the pain is crushing or pressure-like and spreads to the arms, back, neck, or jaw, or if you have sweating, nausea (feeling sick to your stomach), or shortness of breath. THIS IS AN EMERGENCY. Don't wait to see if the pain will go away. Get medical help at once. Call 911 or 0 (operator). DO NOT drive yourself to the hospital.   Your chest pain gets worse and does not go away with rest.   You have an attack of chest pain lasting longer than usual, despite rest and treatment with the medications your caregiver has prescribed.   You wake from sleep with chest pain or shortness of breath.  You feel dizzy or faint.  You have chest pain not typical of your usual pain for which you originally saw your caregiver.   You have any other emergent concerns regarding your health.  Your vital signs today were: BP 128/63 (BP Location: Right Arm)   Pulse (!) 50   Temp 98.3 F (36.8 C) (Oral)   Resp (!) 21   Ht 5\' 9"  (1.753 m)   Wt 95.7 kg   SpO2 99%   BMI 31.16 kg/m  If your blood pressure (BP) was elevated above 135/85 this visit, please have this repeated by your doctor within one month. --------------

## 2021-01-24 DIAGNOSIS — H52203 Unspecified astigmatism, bilateral: Secondary | ICD-10-CM | POA: Diagnosis not present

## 2021-01-24 DIAGNOSIS — H1789 Other corneal scars and opacities: Secondary | ICD-10-CM | POA: Diagnosis not present

## 2021-01-24 DIAGNOSIS — H2513 Age-related nuclear cataract, bilateral: Secondary | ICD-10-CM | POA: Diagnosis not present

## 2021-01-24 DIAGNOSIS — H43813 Vitreous degeneration, bilateral: Secondary | ICD-10-CM | POA: Diagnosis not present

## 2021-02-01 ENCOUNTER — Other Ambulatory Visit: Payer: Self-pay

## 2021-02-02 ENCOUNTER — Other Ambulatory Visit: Payer: Self-pay

## 2021-02-02 ENCOUNTER — Ambulatory Visit (INDEPENDENT_AMBULATORY_CARE_PROVIDER_SITE_OTHER): Payer: Medicare HMO | Admitting: Family Medicine

## 2021-02-02 ENCOUNTER — Encounter: Payer: Self-pay | Admitting: Cardiology

## 2021-02-02 ENCOUNTER — Encounter: Payer: Self-pay | Admitting: Family Medicine

## 2021-02-02 ENCOUNTER — Encounter: Payer: Self-pay | Admitting: Radiology

## 2021-02-02 ENCOUNTER — Ambulatory Visit: Payer: Medicare HMO | Admitting: Cardiology

## 2021-02-02 VITALS — BP 120/60 | HR 56 | Ht 69.0 in | Wt 214.0 lb

## 2021-02-02 VITALS — BP 120/80 | HR 58 | Temp 97.5°F | Wt 212.6 lb

## 2021-02-02 DIAGNOSIS — R002 Palpitations: Secondary | ICD-10-CM

## 2021-02-02 DIAGNOSIS — M353 Polymyalgia rheumatica: Secondary | ICD-10-CM | POA: Diagnosis not present

## 2021-02-02 DIAGNOSIS — R0602 Shortness of breath: Secondary | ICD-10-CM | POA: Diagnosis not present

## 2021-02-02 DIAGNOSIS — Z0000 Encounter for general adult medical examination without abnormal findings: Secondary | ICD-10-CM

## 2021-02-02 NOTE — Progress Notes (Signed)
Small    Cardiology Office Note   Date:  02/02/2021   ID:  Jeremy Cardenas, DOB [birth year], MRN [id number]  PCP:  Libby Maw, MD  Cardiologist:   None Referring:  Libby Maw, MD  Chief Complaint  Patient presents with   Palpitations       History of Present Illness: Jeremy Cardenas is a 75 y.o. male who is referred by Libby Maw, MD for evaluation of palpitations.  He was in the emergency room in June for this.  I reviewed these records for this visit.  There was no clear etiology identified.  Chest x-ray was unremarkable.  He had no prior cardiac work-up or history.  When he was at the [facility] he had an episode of rapid heartbeat.  He was resting when this happened and watching TV.  He had another episode in late June the day before he went to the emergency room.  He was relaxing and he felt his heart racing.  It was irregular.  It lasted for about 30 minutes.  It went away spontaneously.  He has not had it since then.  He did not have presyncope or syncope.  He has noticed his blood pressure is running a little bit higher.  He thinks he has had some decreased exercise tolerance in the last year since he had COVID.  He went to the emergency room the day after his symptoms and the work-up was as above.  He does not have chest pressure, neck or arm discomfort.  He does not have an PND or orthopnea.  He can do activities such as cutting firewood.  He does get some shortness of breath with activity.  Past Medical History:  Diagnosis Date   Colon polyps    COVID-19    Hearing loss    Hyperlipidemia    Hypothyroidism    Internal hemorrhoids    Leiomyoma    PMR (polymyalgia rheumatica) (HCC)    Sigmoid diverticulosis    Sleep apnea    CPAP    Past Surgical History:  Procedure Laterality Date   COLONOSCOPY W/ BIOPSIES     rotator cuff surgery       Current Outpatient Medications  Medication Sig Dispense Refill   co-enzyme Q-10 30 MG capsule  Take 30 mg by mouth daily.     diclofenac sodium (VOLTAREN) 1 % GEL Apply a small amount to the sore space on the knee 3 times a day as needed. 200 g 2   fluticasone (FLONASE) 50 MCG/ACT nasal spray Place 1 spray into both nostrils as needed.      folic acid (FOLVITE) 1 MG tablet Take 1 mg by mouth daily.     glucosamine-chondroitin 500-400 MG tablet Take 1 tablet by mouth 2 (two) times daily.     Magnesium 400 MG TABS Take 1 tablet by mouth daily.     methotrexate (RHEUMATREX) 2.5 MG tablet Take 2.5 mg by mouth. Takes 5 Tabs Every Friday     Omega-3 Fatty Acids (SALMON OIL-1000) 200 MG CAPS Take by mouth daily.     OVER THE COUNTER MEDICATION 600 mg daily. 2 pills daily     predniSONE (DELTASONE) 10 MG tablet Take 1 mg by mouth daily. Takes 1 MG daily     saw palmetto 160 MG capsule Take 160 mg by mouth 2 (two) times daily.     tamsulosin (FLOMAX) 0.4 MG CAPS capsule TAKE 1 CAPSULE BY MOUTH EVERY DAY 90 capsule 0  Turmeric POWD by Does not apply route.     Vitamin D, Ergocalciferol, (DRISDOL) 1.25 MG (50000 UT) CAPS capsule Take 1 capsule by mouth once a week.     No current facility-administered medications for this visit.    Allergies:   Amoxicillin    Social History:  The patient  reports that he quit smoking about 39 years ago. His smoking use included cigarettes. He has a 20.00 pack-year smoking history. He has never used smokeless tobacco. He reports current alcohol use. He reports that he does not use drugs.   Family History:  The patient's family history includes Colon cancer in an other family member; Diabetes in an other family member; Heart attack (age of onset: 59) in his father; Stomach cancer in his mother.    ROS:  Please see the history of present illness.   Otherwise, review of systems are positive for none.   All other systems are reviewed and negative.    PHYSICAL EXAM: VS:  BP 120/60 (BP Location: Left Arm)   Pulse (!) 56   Ht 5\' 9"  (1.753 m)   Wt 214 lb (97.1  kg)   SpO2 95%   BMI 31.60 kg/m  , BMI Body mass index is 31.6 kg/m. GENERAL:  Well appearing HEENT:  Pupils equal round and reactive, fundi not visualized, oral mucosa unremarkable NECK:  No jugular venous distention, waveform within normal limits, carotid upstroke brisk and symmetric, no bruits, no thyromegaly LYMPHATICS:  No cervical, inguinal adenopathy LUNGS:  Clear to auscultation bilaterally BACK:  No CVA tenderness CHEST:  Unremarkable HEART:  PMI not displaced or sustained,S1 and S2 within normal limits, no S3, no S4, no clicks, no rubs, no murmurs ABD:  Flat, positive bowel sounds normal in frequency in pitch, no bruits, no rebound, no guarding, no midline pulsatile mass, no hepatomegaly, no splenomegaly EXT:  2 plus pulses throughout, no edema, no cyanosis no clubbing SKIN:  No rashes no nodules NEURO:  Cranial nerves II through XII grossly intact, motor grossly intact throughout PSYCH:  Cognitively intact, oriented to person place and time    EKG:  EKG is not ordered today. The ekg ordered 01/03/2021 demonstrates sinus rhythm, rate 57, axis within normal limits, intervals within normal limits, poor anterior R wave progression, no acute ST-T wave changes.   Recent Labs: 01/03/2021: BUN 18; Creatinine, Ser 0.77; Hemoglobin 14.5; Platelets 170; Potassium 4.0; Sodium 135    Lipid Panel    Component Value Date/Time   CHOL 163 06/11/2017 1042   TRIG 126.0 06/11/2017 1042   HDL 37.30 (L) 06/11/2017 1042   CHOLHDL 4 06/11/2017 1042   VLDL 25.2 06/11/2017 1042   LDLCALC 100 (H) 06/11/2017 1042      Wt Readings from Last 3 Encounters:  02/02/21 214 lb (97.1 kg)  02/02/21 212 lb 9.6 oz (96.4 kg)  01/03/21 211 lb (95.7 kg)      Other studies Reviewed: Additional studies/ records that were reviewed today include: ED records. Review of the above records demonstrates:  Please see elsewhere in the note.     ASSESSMENT AND PLAN:   PALPITATIONS: I am going to have him  wear a 4-week monitor.  Also his weight has not a Watch and can use this if he has had any further symptoms in the future.  I will check a TSH.  Other labs are pending but his primary provider.  SOB: Patient does have some shortness of breath.  He has a mildly abnormal EKG with  poor anterior R wave progression.  I am going to screen him with a .POET (Plain Old Exercise Treadmill)   Current medicines are reviewed at length with the patient today.  The patient does not have concerns regarding medicines.  The following changes have been made:  no change  Labs/ tests ordered today include: None  Orders Placed This Encounter  Procedures   TSH   CARDIAC EVENT MONITOR   Exercise Tolerance Test      Disposition:   FU with me as needed based on the results of the above.   Signed, Minus Breeding, MD  02/02/2021 4:11 PM    [facility]

## 2021-02-02 NOTE — Progress Notes (Signed)
Established Patient Office Visit  Subjective:  Patient ID: Jeremy Cardenas, male    DOB: October 02, 1945  Age: 75 y.o. MRN: [id number]  CC:  Chief Complaint  Patient presents with   Follow-up    Pt c/o of irregular HR x3-4 weeks, ER visit 01/03/2021 , BP ranges 120/66 - 143/80    HPI Jeremy Cardenas presents for follow-up of emergency room visit about a month ago for palpitations.  Work-up at that time was negative.  He describes the palpitations as an irregular heartbeat that can be faster than usual.  He denies the accompany of shortness of breath, chest pain, nausea or diaphoresis.  They do make him dizzy.  He has noticed that he does not have the same stamina as he has in the past.  He is not physically active other than doing yard work.  He has no regular exercise routine.  He does not smoke.  He drinks 1 to 2 glasses of wine nightly.  Currently under treatment for PMR with methotrexate and a tapering dose of prednisone that is now 1 mg daily he tells me.  He takes tamsulosin for BPH and this helps.  He does not smoke or use illicit drugs.  He denies anxiety or depression.  He is accompanied by his wife.  Past Medical History:  Diagnosis Date   Colon polyps    COVID-19    Hearing loss    Hyperlipidemia    Hypothyroidism    Internal hemorrhoids    Leiomyoma    Sigmoid diverticulosis    Sleep apnea     Past Surgical History:  Procedure Laterality Date   ANKLE FUSION     COLONOSCOPY W/ BIOPSIES     INGUINAL HERNIA REPAIR     rotator cuff surgery      Family History  Problem Relation Age of Onset   Colon cancer Other    Diabetes Other     Social History   Socioeconomic History   Marital status: Married    Spouse name: Not on file   Number of children: Not on file   Years of education: Not on file   Highest education level: Not on file  Occupational History   Not on file  Tobacco Use   Smoking status: Former    Packs/day: 1.00    Years: 20.00    Pack years:  20.00    Types: Cigarettes    Quit date: 07/29/1981    Years since quitting: 39.5   Smokeless tobacco: Never  Substance and Sexual Activity   Alcohol use: Yes    Comment: occ   Drug use: No   Sexual activity: Not on file  Other Topics Concern   Not on file  Social History Narrative   Not on file   Social Determinants of Health   Financial Resource Strain: Not on file  Food Insecurity: Not on file  Transportation Needs: Not on file  Physical Activity: Not on file  Stress: Not on file  Social Connections: Not on file  Intimate Partner Violence: Not on file    Outpatient Medications Prior to Visit  Medication Sig Dispense Refill   co-enzyme Q-10 30 MG capsule Take 30 mg by mouth daily.     fluticasone (FLONASE) 50 MCG/ACT nasal spray Place 1 spray into both nostrils as needed.      folic acid (FOLVITE) 1 MG tablet Take 1 mg by mouth daily.     glucosamine-chondroitin 500-400 MG tablet Take 1 tablet by  mouth 3 (three) times daily.     Magnesium 400 MG TABS Take 1 tablet by mouth daily.     methotrexate (RHEUMATREX) 2.5 MG tablet Take 1 tablet by mouth. 3 times a week     Omega-3 Fatty Acids (SALMON OIL-1000) 200 MG CAPS Take by mouth daily.     OVER THE COUNTER MEDICATION 600 mg daily. 2 pills daily     predniSONE (DELTASONE) 10 MG tablet Take 10 mg by mouth daily.     saw palmetto 160 MG capsule Take 160 mg by mouth 2 (two) times daily.     tamsulosin (FLOMAX) 0.4 MG CAPS capsule TAKE 1 CAPSULE BY MOUTH EVERY DAY 90 capsule 0   Vitamin D, Ergocalciferol, (DRISDOL) 1.25 MG (50000 UT) CAPS capsule Take 1 capsule by mouth once a week.     diclofenac sodium (VOLTAREN) 1 % GEL Apply a small amount to the sore space on the knee 3 times a day as needed. (Patient not taking: No sig reported) 200 g 2   sildenafil (REVATIO) 20 MG tablet Take 1 to 2 tabs 2 - 3 hours before sex (Patient not taking: Reported on 02/02/2021) 30 tablet 11   Turmeric POWD by Does not apply route. (Patient not  taking: Reported on 02/02/2021)     aspirin 81 MG tablet Take 81 mg by mouth daily. (Patient not taking: Reported on 02/02/2021)     No facility-administered medications prior to visit.    Allergies  Allergen Reactions   Amoxicillin Rash    ROS Review of Systems  Constitutional:  Negative for diaphoresis, fatigue, fever and unexpected weight change.  HENT:  Positive for hearing loss. Negative for postnasal drip and sore throat.   Eyes:  Negative for photophobia and visual disturbance.  Respiratory:  Negative for chest tightness, shortness of breath and wheezing.   Cardiovascular:  Positive for palpitations. Negative for chest pain and leg swelling.  Gastrointestinal: Negative.  Negative for abdominal pain and constipation.  Endocrine: Negative for polyphagia and polyuria.  Genitourinary: Negative.   Musculoskeletal:  Negative for gait problem and joint swelling.  Skin: Negative.   Neurological:  Negative for speech difficulty and weakness.  Psychiatric/Behavioral:  Negative for dysphoric mood. The patient is not nervous/anxious.      Objective:    Physical Exam Vitals and nursing note reviewed.  Constitutional:      General: He is not in acute distress.    Appearance: Normal appearance. He is not ill-appearing, toxic-appearing or diaphoretic.  HENT:     Head: Normocephalic and atraumatic.     Right Ear: Tympanic membrane, ear canal and external ear normal.     Left Ear: Tympanic membrane, ear canal and external ear normal.     Mouth/Throat:     Mouth: Mucous membranes are dry.     Pharynx: No oropharyngeal exudate or posterior oropharyngeal erythema.  Eyes:     General: No scleral icterus.       Right eye: No discharge.        Left eye: No discharge.     Extraocular Movements: Extraocular movements intact.     Conjunctiva/sclera: Conjunctivae normal.     Pupils: Pupils are equal, round, and reactive to light.  Neck:     Vascular: No carotid bruit.  Cardiovascular:      Rate and Rhythm: Normal rate and regular rhythm.  Pulmonary:     Effort: Pulmonary effort is normal.     Breath sounds: Normal breath sounds.  Abdominal:  General: Abdomen is flat. Bowel sounds are normal.     Palpations: Abdomen is soft.     Hernia: A hernia is present. Hernia is present in the ventral area.  Musculoskeletal:     Cervical back: No rigidity or tenderness.     Right lower leg: No edema.     Left lower leg: No edema.  Lymphadenopathy:     Cervical: No cervical adenopathy.  Skin:    General: Skin is warm and dry.  Neurological:     Mental Status: He is alert and oriented to person, place, and time.    BP 120/80   Pulse (!) 58   Temp (!) 97.5 F (36.4 C)   Wt 212 lb 9.6 oz (96.4 kg)   SpO2 95%   BMI 31.40 kg/m  Wt Readings from Last 3 Encounters:  02/02/21 212 lb 9.6 oz (96.4 kg)  01/03/21 211 lb (95.7 kg)  05/17/20 200 lb (90.7 kg)     Health Maintenance Due  Topic Date Due   COVID-19 Vaccine (1) Never done   Hepatitis C Screening  Never done   Zoster Vaccines- Shingrix (1 of 2) Never done   PNA vac Low Risk Adult (2 of 2 - PCV13) 05/29/2011   TETANUS/TDAP  08/22/2018    There are no preventive care reminders to display for this patient.  Lab Results  Component Value Date   TSH 1.87 06/11/2017   Lab Results  Component Value Date   WBC 5.8 01/03/2021   HGB 14.5 01/03/2021   HCT 42.9 01/03/2021   MCV 97.3 01/03/2021   PLT 170 01/03/2021   Lab Results  Component Value Date   NA 135 01/03/2021   K 4.0 01/03/2021   CO2 25 01/03/2021   GLUCOSE 148 (H) 01/03/2021   BUN 18 01/03/2021   CREATININE 0.77 01/03/2021   BILITOT 0.7 06/11/2017   ALKPHOS 54 06/11/2017   AST 22 06/11/2017   ALT 25 06/11/2017   PROT 7.1 06/11/2017   ALBUMIN 4.1 06/11/2017   CALCIUM 9.0 01/03/2021   ANIONGAP 5 01/03/2021   GFR 99.11 06/11/2017   Lab Results  Component Value Date   CHOL 163 06/11/2017   Lab Results  Component Value Date   HDL 37.30 (L)  06/11/2017   Lab Results  Component Value Date   LDLCALC 100 (H) 06/11/2017   Lab Results  Component Value Date   TRIG 126.0 06/11/2017   Lab Results  Component Value Date   CHOLHDL 4 06/11/2017   No results found for: HGBA1C    Assessment & Plan:   Problem List Items Addressed This Visit       Other   Healthcare maintenance - Primary   Relevant Orders   CBC   Comprehensive metabolic panel   Lipid panel   PSA   Urinalysis, Routine w reflex microscopic   Hemoglobin A1c   PMR (polymyalgia rheumatica) (HCC)   Palpitations   Relevant Orders   Ambulatory referral to Cardiology    No orders of the defined types were placed in this encounter.   Follow-up: Return in about 3 months (around 05/05/2021), or Return fasting for above ordered blood work..   Given information on health maintenance and disease prevention.  Also given information on palpitations.  Have referred to cardiology.  Advised that alcohol can cause palpitations and irregular heart beats.  Follow-up in 3 months or sooner if needed. Libby Maw, MD

## 2021-02-02 NOTE — Progress Notes (Signed)
Enrolled patient for a 30 day preventice monitor to be mailed to patients home Patient request for monitor to arrive after 8/17

## 2021-02-02 NOTE — Patient Instructions (Signed)
Medication Instructions:  Your physician recommends that you continue on your current medications as directed. Please refer to the Current Medication list given to you today.   Labwork: TSH AT YOUR PRIMARY CARE   Testing/Procedures: Your physician has requested that you have an exercise tolerance test. For further information please visit [email or web address]. Please also follow instruction sheet, as given.  Your physician has recommended that you wear an event monitor. Event monitors are medical devices that record the heart's electrical activity. Doctors most often [place] these monitors to diagnose arrhythmias. Arrhythmias are problems with the speed or rhythm of the heartbeat. The monitor is a small, portable device. You can wear one while you do your normal daily activities. This is usually used to diagnose what is causing palpitations/syncope (passing out).  Follow-Up: AS NEEDED  Any Other Special Instructions Will Be Listed Below (If Applicable).  [employer] Cardiac Event Monitor Instructions Your physician has requested you wear your cardiac event monitor for __30___ days, (1-30). [employer] may call or text to confirm a shipping address. The monitor will be sent to a land address via UPS. [employer] will not ship a monitor to a PO BOX. It typically takes 3-5 days to receive your monitor after it has been enrolled. [employer] will assist with USPS tracking if your package is delayed. The telephone number for [employer] is [phone]. Once you have received your monitor, please review the enclosed instructions. Instruction tutorials can also be viewed under help and settings on the enclosed cell phone. Your monitor has already been registered assigning a specific monitor serial # to you.  Applying the monitor Remove cell phone from case and turn it on. The cell phone works as Dealer and needs to be within [employer] of you at all times. The cell phone will need to be charged  on a daily basis. We recommend you plug the cell phone into the enclosed charger at your bedside table every night.  Monitor batteries: You will receive two monitor batteries labelled #1 and #2. These are your recorders. Plug battery #2 onto the second connection on the enclosed charger. Keep one battery on the charger at all times. This will keep the monitor battery deactivated. It will also keep it fully charged for when you need to switch your monitor batteries. A small light will be blinking on the battery emblem when it is charging. The light on the battery emblem will remain on when the battery is fully charged.  Open package of a Monitor strip. Insert battery #1 into black hood on strip and gently squeeze monitor battery onto connection as indicated in instruction booklet. Set aside while preparing skin.  Choose location for your strip, vertical or horizontal, as indicated in the instruction booklet. Shave to remove all hair from location. There cannot be any lotions, oils, powders, or colognes on skin where monitor is to be applied. Wipe skin clean with enclosed Saline wipe. Dry skin completely.  Peel paper labeled #1 off the back of the Monitor strip exposing the adhesive. Place the monitor on the chest in the vertical or horizontal position shown in the instruction booklet. One arrow on the monitor strip must be pointing upward. Carefully remove paper labeled #2, attaching remainder of strip to your skin. Try not to create any folds or wrinkles in the strip as you apply it.  Firmly press and release the circle in the center of the monitor battery. You will hear a small beep. This is turning the monitor battery on. The  heart emblem on the monitor battery will light up every 5 seconds if the monitor battery in turned on and connected to the patient securely. Do not push and hold the circle down as this turns the monitor battery off. The cell phone will locate the monitor battery. A  screen will appear on the cell phone checking the connection of your monitor strip. This may read poor connection initially but change to good connection within the next minute. Once your monitor accepts the connection you will hear a series of 3 beeps followed by a climbing crescendo of beeps. A screen will appear on the cell phone showing the two monitor strip placement options. Touch the picture that demonstrates where you applied the monitor strip.  Your monitor strip and battery are waterproof. You are able to shower, bathe, or swim with the monitor on. They just ask you do not submerge deeper than 3 feet underwater. We recommend removing the monitor if you are swimming in a lake, river, or ocean.  Your monitor battery will need to be switched to a fully charged monitor battery approximately once a week. The cell phone will alert you of an action which needs to be made.  On the cell phone, tap for details to reveal connection status, monitor battery status, and cell phone battery status. The green dots indicates your monitor is in good status. A red dot indicates there is something that needs your attention.  To record a symptom, click the circle on the monitor battery. In 30-60 seconds a list of symptoms will appear on the cell phone. Select your symptom and tap save. Your monitor will record a sustained or significant arrhythmia regardless of you clicking the button. Some patients do not feel the heart rhythm irregularities. [employer] will notify us of any serious or critical events.  Refer to instruction booklet for instructions on switching batteries, changing strips, the Do not disturb or Pause features, or any additional questions.  Call [employer] at [phone], to confirm your monitor is transmitting and record your baseline. They will answer any questions you may have regarding the monitor instructions at that time.  Returning the monitor to [place] all  equipment back into blue box. Peel off strip of paper to expose adhesive and close box securely. There is a prepaid UPS shipping label on this box. Drop in a UPS drop box, or at a UPS facility like [employer]. You may also contact [employer] to arrange UPS to pick up monitor package at your home.

## 2021-02-05 ENCOUNTER — Other Ambulatory Visit: Payer: Self-pay

## 2021-02-05 ENCOUNTER — Other Ambulatory Visit (INDEPENDENT_AMBULATORY_CARE_PROVIDER_SITE_OTHER): Payer: Medicare HMO

## 2021-02-05 DIAGNOSIS — Z0000 Encounter for general adult medical examination without abnormal findings: Secondary | ICD-10-CM | POA: Diagnosis not present

## 2021-02-05 LAB — COMPREHENSIVE METABOLIC PANEL
ALT: 32 U/L (ref 0–53)
AST: 27 U/L (ref 0–37)
Albumin: 4.1 g/dL (ref 3.5–5.2)
Alkaline Phosphatase: 63 U/L (ref 39–117)
BUN: 16 mg/dL (ref 6–23)
CO2: 27 mEq/L (ref 19–32)
Calcium: 8.9 mg/dL (ref 8.4–10.5)
Chloride: 105 mEq/L (ref 96–112)
Creatinine, Ser: 0.77 mg/dL (ref 0.40–1.50)
GFR: 86.5 mL/min (ref >60.00)
Glucose, Bld: 104 mg/dL — ABNORMAL HIGH (ref 70–99)
Potassium: 4.2 mEq/L (ref 3.5–5.1)
Sodium: 138 mEq/L (ref 135–145)
Total Bilirubin: 0.7 mg/dL (ref 0.2–1.2)
Total Protein: 7 g/dL (ref 6.0–8.3)

## 2021-02-05 LAB — URINALYSIS, ROUTINE W REFLEX MICROSCOPIC
Bilirubin Urine: NEGATIVE
Ketones, ur: NEGATIVE
Leukocytes,Ua: NEGATIVE
Nitrite: NEGATIVE
Specific Gravity, Urine: <=1.005 — AB (ref 1.000–1.030)
Total Protein, Urine: NEGATIVE
Urine Glucose: NEGATIVE
Urobilinogen, UA: 0.2 (ref 0.0–1.0)
WBC, UA: NONE SEEN (ref 0–?)
pH: 6 (ref 5.0–8.0)

## 2021-02-05 LAB — LIPID PANEL
Cholesterol: 144 mg/dL (ref 0–200)
HDL: 34.9 mg/dL — ABNORMAL LOW (ref >39.00)
LDL Cholesterol: 83 mg/dL (ref 0–99)
NonHDL: 108.95
Total CHOL/HDL Ratio: 4
Triglycerides: 130 mg/dL (ref 0.0–149.0)
VLDL: 26 mg/dL (ref 0.0–40.0)

## 2021-02-05 LAB — CBC
HCT: 41.8 % (ref 39.0–52.0)
Hemoglobin: 14.1 g/dL (ref 13.0–17.0)
MCHC: 33.7 g/dL (ref 30.0–36.0)
MCV: 98.2 fl (ref 78.0–100.0)
Platelets: 157 10*3/uL (ref 150.0–400.0)
RBC: 4.25 Mil/uL (ref 4.22–5.81)
RDW: 14.9 % (ref 11.5–15.5)
WBC: 4.5 10*3/uL (ref 4.0–10.5)

## 2021-02-05 LAB — PSA: PSA: 2.75 ng/mL (ref 0.10–4.00)

## 2021-02-06 ENCOUNTER — Telehealth (HOSPITAL_COMMUNITY): Payer: Self-pay | Admitting: *Deleted

## 2021-02-06 NOTE — Telephone Encounter (Signed)
Close encounter 

## 2021-02-08 ENCOUNTER — Telehealth: Payer: Self-pay | Admitting: Family Medicine

## 2021-02-08 NOTE — Telephone Encounter (Signed)
Returned patients call and went over labs with patient.

## 2021-02-08 NOTE — Telephone Encounter (Signed)
Pt is wanting a cb concerning his most recent lab results. Please advise pt at [phone].

## 2021-02-09 ENCOUNTER — Ambulatory Visit (HOSPITAL_COMMUNITY)
Admission: RE | Admit: 2021-02-09 | Discharge: 2021-02-09 | Disposition: A | Discharge status: Home / Self Care | Payer: Medicare HMO | Source: Ambulatory Visit | Attending: Cardiology | Admitting: Cardiology

## 2021-02-09 ENCOUNTER — Encounter: Payer: Self-pay | Admitting: Cardiology

## 2021-02-09 ENCOUNTER — Ambulatory Visit: Payer: Medicare HMO | Admitting: Cardiology

## 2021-02-09 ENCOUNTER — Other Ambulatory Visit: Payer: Self-pay

## 2021-02-09 ENCOUNTER — Encounter (HOSPITAL_COMMUNITY): Payer: Self-pay

## 2021-02-09 VITALS — BP 120/70 | HR 131 | Ht 69.0 in | Wt 207.8 lb

## 2021-02-09 DIAGNOSIS — R0602 Shortness of breath: Secondary | ICD-10-CM

## 2021-02-09 DIAGNOSIS — I4819 Other persistent atrial fibrillation: Secondary | ICD-10-CM

## 2021-02-09 DIAGNOSIS — E7800 Pure hypercholesterolemia, unspecified: Secondary | ICD-10-CM | POA: Diagnosis not present

## 2021-02-09 DIAGNOSIS — R002 Palpitations: Secondary | ICD-10-CM

## 2021-02-09 MED ORDER — DILTIAZEM HCL ER COATED BEADS 180 MG PO CP24: 180.0000 mg | ORAL_CAPSULE | Freq: Every day | ORAL | 3 refills | Status: DC

## 2021-02-09 MED ORDER — APIXABAN 5 MG PO TABS: 5.0000 mg | ORAL_TABLET | Freq: Two times a day (BID) | ORAL | 6 refills | Status: DC

## 2021-02-09 NOTE — Patient Instructions (Signed)
Medication Instructions:   START DILTIAZEM 180 MG ONCE DAILY  START ELIQUIS 5 MG ONE TABLET TWICE DAILY  *If you need a refill on your cardiac medications before your next appointment, please call your pharmacy*   Testing/Procedures:  Your physician has requested that you have an echocardiogram. Echocardiography is a painless test that uses sound waves to create images of your heart. It provides your doctor with information about the size and shape of your heart and how well your heart's chambers and valves are working. This procedure takes approximately one hour. There are no restrictions for this procedure. [place]   Follow-Up: At [facility], you and your health needs are our priority.  As part of our continuing mission to provide you with exceptional heart care, we have created designated Provider Care Teams.  These Care Teams include your primary Cardiologist (physician) and Advanced Practice Providers (APPs -  Physician Assistants and Nurse Practitioners) who all work together to provide you with the care you need, when you need it.  We recommend signing up for the patient portal called "MyChart".  Sign up information is provided on this After Visit Summary.  MyChart is used to connect with patients for Virtual Visits (Telemedicine).  Patients are able to view lab/test results, encounter notes, upcoming appointments, etc.  Non-urgent messages can be sent to your provider as well.   To learn more about what you can do with MyChart, go to [email or web address].    Your next appointment:   1 week(s)  The format for your next appointment:   In Person  Provider:   You will see one of the following Advanced Practice Providers on your designated Care Team:   Rosaria Ferries, PA-C Caron Presume, PA-C Jory Sims, DNP, ANP  Then, [facility] will plan to see you again in 6 week(s).

## 2021-02-09 NOTE — Progress Notes (Signed)
HPI: FU atrial fibrillation.  Patient seen recently by Dr. Percival Spanish for evaluation of palpitations.  At that time no etiology was identified.  He was scheduled for an exercise treadmill.  When he arrived he was found to be in asymptomatic atrial fibrillation was added to my schedule.  Patient denies dyspnea, chest pain, palpitations or syncope.  Current Outpatient Medications  Medication Sig Dispense Refill   co-enzyme Q-10 30 MG capsule Take 30 mg by mouth daily.     diclofenac sodium (VOLTAREN) 1 % GEL Apply a small amount to the sore space on the knee 3 times a day as needed. 200 g 2   fluticasone (FLONASE) 50 MCG/ACT nasal spray Place 1 spray into both nostrils as needed.      folic acid (FOLVITE) 1 MG tablet Take 1 mg by mouth daily.     glucosamine-chondroitin 500-400 MG tablet Take 1 tablet by mouth 2 (two) times daily.     Magnesium 400 MG TABS Take 1 tablet by mouth daily.     methotrexate (RHEUMATREX) 2.5 MG tablet Take 2.5 mg by mouth. Takes 5 Tabs Every Friday     Omega-3 Fatty Acids (SALMON OIL-1000) 200 MG CAPS Take by mouth daily.     OVER THE COUNTER MEDICATION 600 mg daily. 2 pills daily     predniSONE (DELTASONE) 10 MG tablet Take 1 mg by mouth daily. Takes 1 MG daily     saw palmetto 160 MG capsule Take 160 mg by mouth 2 (two) times daily.     tamsulosin (FLOMAX) 0.4 MG CAPS capsule TAKE 1 CAPSULE BY MOUTH EVERY DAY 90 capsule 0   Turmeric POWD by Does not apply route.     Vitamin D, Ergocalciferol, (DRISDOL) 1.25 MG (50000 UT) CAPS capsule Take 1 capsule by mouth once a week.     No current facility-administered medications for this visit.     Past Medical History:  Diagnosis Date   Colon polyps    COVID-19    Hearing loss    Hyperlipidemia    Hypothyroidism    Internal hemorrhoids    Leiomyoma    PMR (polymyalgia rheumatica) (HCC)    Sigmoid diverticulosis    Sleep apnea    CPAP    Past Surgical History:  Procedure Laterality Date   COLONOSCOPY  W/ BIOPSIES     rotator cuff surgery      Social History   Socioeconomic History   Marital status: Married    Spouse name: Not on file   Number of children: Not on file   Years of education: Not on file   Highest education level: Not on file  Occupational History   Not on file  Tobacco Use   Smoking status: Former    Packs/day: 1.00    Years: 20.00    Pack years: 20.00    Types: Cigarettes    Quit date: 07/29/1981    Years since quitting: 39.5   Smokeless tobacco: Never  Substance and Sexual Activity   Alcohol use: Yes    Comment: occ   Drug use: No   Sexual activity: Not on file  Other Topics Concern   Not on file  Social History Narrative   Married.  [employer].     Social Determinants of Health   Financial Resource Strain: Not on file  Food Insecurity: Not on file  Transportation Needs: Not on file  Physical Activity: Not on file  Stress: Not on file  Social Connections: Not on file  Intimate Partner Violence: Not on file    Family History  Problem Relation Age of Onset   Stomach cancer Mother    Heart attack Father 48       Died age 74   Colon cancer Other    Diabetes Other     ROS: no fevers or chills, productive cough, hemoptysis, dysphasia, odynophagia, melena, hematochezia, dysuria, hematuria, rash, seizure activity, orthopnea, PND, pedal edema, claudication. Remaining systems are negative.  Physical Exam: Well-developed well-nourished in no acute distress.  Skin is warm and dry.  HEENT is normal.  Neck is supple.  Chest is clear to auscultation with normal expansion.  Cardiovascular exam is irregular and tachycardic Abdominal exam nontender or distended. No masses palpated. Extremities show no edema. neuro grossly intact  ECG-atrial fibrillation at a rate of 131, nonspecific ST changes.  Personally reviewed  A/P  1 paroxysmal atrial fibrillation-patient with newly diagnosed atrial fibrillation.  Will add Cardizem CD 180 mg daily.   CHA2DS2-VASc is 2 for age greater than 32.  Add apixaban 5 mg twice daily.  Schedule echocardiogram to assess LV function.  Check TSH.  I will have him return to see an APP in 4 to 6 weeks.  If he remains in atrial fibrillation could arrange elective cardioversion at that time.  Note he is relatively asymptomatic and if he does not hold sinus in the future rate control and anticoagulation would certainly be an option.  2 hyperlipidemia-Per primary care.  3 sleep apnea-continue CPAP.  Kirk Ruths, MD

## 2021-02-10 LAB — TSH: TSH: 1.87 u[IU]/mL (ref 0.450–4.500)

## 2021-02-13 ENCOUNTER — Other Ambulatory Visit: Payer: Self-pay

## 2021-02-13 ENCOUNTER — Ambulatory Visit (HOSPITAL_COMMUNITY): Payer: Medicare HMO | Attending: Cardiology

## 2021-02-13 DIAGNOSIS — I4819 Other persistent atrial fibrillation: Secondary | ICD-10-CM | POA: Diagnosis not present

## 2021-02-13 LAB — ECHOCARDIOGRAM COMPLETE
Area-P 1/2: 3.6 cm2
S' Lateral: 3.6 cm

## 2021-02-15 ENCOUNTER — Encounter: Payer: Self-pay | Admitting: *Deleted

## 2021-02-16 ENCOUNTER — Encounter: Payer: Self-pay | Admitting: *Deleted

## 2021-02-17 DIAGNOSIS — R519 Headache, unspecified: Secondary | ICD-10-CM | POA: Diagnosis not present

## 2021-02-17 DIAGNOSIS — R059 Cough, unspecified: Secondary | ICD-10-CM | POA: Diagnosis not present

## 2021-02-17 DIAGNOSIS — R509 Fever, unspecified: Secondary | ICD-10-CM | POA: Diagnosis not present

## 2021-02-17 DIAGNOSIS — Z20828 Contact with and (suspected) exposure to other viral communicable diseases: Secondary | ICD-10-CM | POA: Diagnosis not present

## 2021-02-25 NOTE — Progress Notes (Signed)
Patient rescheduled. Error.   Loel Dubonnet, NP

## 2021-02-26 ENCOUNTER — Ambulatory Visit (INDEPENDENT_AMBULATORY_CARE_PROVIDER_SITE_OTHER): Payer: Medicare HMO | Admitting: Family

## 2021-02-26 DIAGNOSIS — I4819 Other persistent atrial fibrillation: Secondary | ICD-10-CM

## 2021-03-14 DIAGNOSIS — I480 Paroxysmal atrial fibrillation: Secondary | ICD-10-CM | POA: Insufficient documentation

## 2021-03-14 DIAGNOSIS — I4891 Unspecified atrial fibrillation: Secondary | ICD-10-CM | POA: Insufficient documentation

## 2021-03-14 DIAGNOSIS — R0602 Shortness of breath: Secondary | ICD-10-CM | POA: Insufficient documentation

## 2021-03-14 NOTE — Progress Notes (Signed)
Small    Cardiology Office Note   Date:  03/15/2021   ID:  Jeremy Cardenas, DOB August 16, 1945, MRN [id number]  PCP:  Libby Maw, MD  Cardiologist:   Minus Breeding, MD   Chief Complaint  Patient presents with   Atrial Fibrillation        History of Present Illness: Jeremy Cardenas is a 75 y.o. male who is referred by Libby Maw, MD for evaluation of palpitations.  He was in the emergency room in June for this.  There was no clear etiology identified.  Chest x-ray was unremarkable.   After the first appt I sent him for a POET (Plain Old Exercise Treadmill) to evaluate SOB.  However, when he presented for this he was in atrial fib.  He saw Dr. Stanford Breed.   He was started Eliquis and Cardizem and sent for an echo which was unremarkable.    Since then he has had no new tachypalpitations.  He denies any chest pressure, neck or arm discomfort.  He had no new shortness of breath, PND or orthopnea.  He said no presyncope or syncope.  He tolerates blood thinner as listed.   Past Medical History:  Diagnosis Date   Colon polyps    COVID-19    Hearing loss    Hyperlipidemia    Hypothyroidism    Internal hemorrhoids    Leiomyoma    PMR (polymyalgia rheumatica) (HCC)    Sigmoid diverticulosis    Sleep apnea    CPAP    Past Surgical History:  Procedure Laterality Date   COLONOSCOPY W/ BIOPSIES     rotator cuff surgery       Current Outpatient Medications  Medication Sig Dispense Refill   apixaban (ELIQUIS) 5 MG TABS tablet Take 1 tablet (5 mg total) by mouth 2 (two) times daily. 60 tablet 6   co-enzyme Q-10 30 MG capsule Take 30 mg by mouth daily.     diclofenac sodium (VOLTAREN) 1 % GEL Apply a small amount to the sore space on the knee 3 times a day as needed. 200 g 2   diltiazem (CARDIZEM CD) 180 MG 24 hr capsule Take 1 capsule (180 mg total) by mouth daily. 90 capsule 3   fluticasone (FLONASE) 50 MCG/ACT nasal spray Place 1 spray into both nostrils as  needed.      folic acid (FOLVITE) 1 MG tablet Take 1 mg by mouth daily.     glucosamine-chondroitin 500-400 MG tablet Take 1 tablet by mouth 2 (two) times daily.     Magnesium 400 MG TABS Take 1 tablet by mouth daily.     methotrexate (RHEUMATREX) 2.5 MG tablet Take 2.5 mg by mouth. Takes 5 Tabs Every Friday     Omega-3 Fatty Acids (SALMON OIL-1000) 200 MG CAPS Take by mouth daily.     OVER THE COUNTER MEDICATION 600 mg daily. 2 pills daily     saw palmetto 160 MG capsule Take 160 mg by mouth 2 (two) times daily.     tamsulosin (FLOMAX) 0.4 MG CAPS capsule TAKE 1 CAPSULE BY MOUTH EVERY DAY 90 capsule 0   Vitamin D, Ergocalciferol, (DRISDOL) 1.25 MG (50000 UT) CAPS capsule Take 1 capsule by mouth once a week.     No current facility-administered medications for this visit.    Allergies:   Amoxicillin    ROS:  Please see the history of present illness.   Otherwise, review of systems are positive for none.   All other  systems are reviewed and negative.    PHYSICAL EXAM: VS:  BP 122/62   Pulse (!) 57   Ht '5\' 9"'$  (1.753 m)   Wt 207 lb 9.6 oz (94.2 kg)   SpO2 97%   BMI 30.66 kg/m  , BMI Body mass index is 30.66 kg/m. GENERAL:  Well appearing NECK:  No jugular venous distention, waveform within normal limits, carotid upstroke brisk and symmetric, no bruits, no thyromegaly LUNGS:  Clear to auscultation bilaterally CHEST:  Unremarkable HEART:  PMI not displaced or sustained,S1 and S2 within normal limits, no S3, no S4,  no clicks, no rubs, no murmurs,  ABD:  Flat, positive bowel sounds normal in frequency in pitch, no bruits, no rebound, no guarding, no midline pulsatile mass, no hepatomegaly, no splenomegaly EXT:  2 plus pulses throughout, no edema, no cyanosis no clubbing   EKG:  EKG is  ordered today. The ekg ordered demonstrates sinus rhythm, rate 57, axis within normal limits, intervals within normal limits, poor anterior R wave progression, no acute ST-T wave changes.   Recent  Labs: 02/05/2021: ALT 32; BUN 16; Creatinine, Ser 0.77; Hemoglobin 14.1; Platelets 157.0; Potassium 4.2; Sodium 138 02/09/2021: TSH 1.870    Lipid Panel    Component Value Date/Time   CHOL 144 02/05/2021 0819   TRIG 130.0 02/05/2021 0819   HDL 34.90 (L) 02/05/2021 0819   CHOLHDL 4 02/05/2021 0819   VLDL 26.0 02/05/2021 0819   LDLCALC 83 02/05/2021 0819      Wt Readings from Last 3 Encounters:  03/15/21 207 lb 9.6 oz (94.2 kg)  02/09/21 207 lb 12.8 oz (94.3 kg)  02/02/21 214 lb (97.1 kg)      Other studies Reviewed: Additional studies/ records that were reviewed today include: Echo Review of the above records demonstrates:  Please see elsewhere in the note.     ASSESSMENT AND PLAN:   ATRIAL FIB:   He is going to get his 4-week monitor.  His labs including TSH were normal.  He is tolerating anticoagulation.  He has been  [name] has a CHA2DS2 - VASc score of 2.  For now we are going to continue with rate control and anticoagulation for his paroxysmal atrial fibrillation.  I would continue the Cardizem.  He will wear the monitor so we can see the burden of his atrial fibrillation.  We talked about getting a Fitbit or other device to monitor as well.  SOB:   At this point he does not want to pursue POET (Plain Old Exercise Treadmill) as he thinks his breathing is getting better and it was probably related to his COVID.    Current medicines are reviewed at length with the patient today.  The patient does not have concerns regarding medicines.  The following changes have been made:  no change  Labs/ tests ordered today include: None  Orders Placed This Encounter  Procedures   EKG 12-Lead       Disposition:   FU with APP in six months.    Signed, Minus Breeding, MD  03/15/2021 12:04 PM    [place]

## 2021-03-15 ENCOUNTER — Encounter: Payer: Self-pay | Admitting: Cardiology

## 2021-03-15 ENCOUNTER — Other Ambulatory Visit: Payer: Self-pay

## 2021-03-15 ENCOUNTER — Ambulatory Visit: Payer: Medicare HMO | Admitting: Cardiology

## 2021-03-15 VITALS — BP 122/62 | HR 57 | Ht 69.0 in | Wt 207.6 lb

## 2021-03-15 DIAGNOSIS — I4891 Unspecified atrial fibrillation: Secondary | ICD-10-CM | POA: Diagnosis not present

## 2021-03-15 DIAGNOSIS — R0602 Shortness of breath: Secondary | ICD-10-CM | POA: Diagnosis not present

## 2021-03-15 NOTE — Patient Instructions (Signed)
  Follow-Up: At [facility], you and your health needs are our priority.  As part of our continuing mission to provide you with exceptional heart care, we have created designated Provider Care Teams.  These Care Teams include your primary Cardiologist (physician) and Advanced Practice Providers (APPs -  Physician Assistants and Nurse Practitioners) who all work together to provide you with the care you need, when you need it.  We recommend signing up for the patient portal called "MyChart".  Sign up information is provided on this After Visit Summary.  MyChart is used to connect with patients for Virtual Visits (Telemedicine).  Patients are able to view lab/test results, encounter notes, upcoming appointments, etc.  Non-urgent messages can be sent to your provider as well.   To learn more about what you can do with MyChart, go to [email or web address].    Your next appointment:   6 month(s)  The format for your next appointment:   In Person  Provider:   You will see one of the following Advanced Practice Providers on your designated Care Team:   Rosaria Ferries, PA-C Caron Presume, PA-C Jory Sims, DNP, ANP

## 2021-03-17 ENCOUNTER — Ambulatory Visit (INDEPENDENT_AMBULATORY_CARE_PROVIDER_SITE_OTHER): Payer: Medicare HMO

## 2021-03-17 ENCOUNTER — Telehealth: Payer: Self-pay | Admitting: Student in an Organized Health Care Education/Training Program

## 2021-03-17 DIAGNOSIS — R002 Palpitations: Secondary | ICD-10-CM

## 2021-03-17 DIAGNOSIS — R0602 Shortness of breath: Secondary | ICD-10-CM | POA: Diagnosis not present

## 2021-03-17 NOTE — Telephone Encounter (Signed)
I was notified by [facility] at 5:47 PM that there was a critical EKG finding for Mr. Jeremy Cardenas.  His heart monitor showed an episode of atrial fibrillation with RVR to 120 bpm that lasted only for several seconds.  He reportedly is still in atrial fibrillation but heart rates are <100.  I called the patient to check in on him.  He says that he feels totally fine and did not even realize that his heart rate has spiked up.  He is completely asymptomatic.  I instructed him that if he develops sustained tachycardia or concerning symptoms then that would be reason to come to the emergency department.  Otherwise he is fine to stay at home.  Patient verbalized understanding.

## 2021-03-19 DIAGNOSIS — M1711 Unilateral primary osteoarthritis, right knee: Secondary | ICD-10-CM | POA: Diagnosis not present

## 2021-03-19 DIAGNOSIS — Z79899 Other long term (current) drug therapy: Secondary | ICD-10-CM | POA: Diagnosis not present

## 2021-03-19 DIAGNOSIS — L820 Inflamed seborrheic keratosis: Secondary | ICD-10-CM | POA: Diagnosis not present

## 2021-03-19 DIAGNOSIS — D485 Neoplasm of uncertain behavior of skin: Secondary | ICD-10-CM | POA: Diagnosis not present

## 2021-03-19 DIAGNOSIS — M353 Polymyalgia rheumatica: Secondary | ICD-10-CM | POA: Diagnosis not present

## 2021-03-19 DIAGNOSIS — E559 Vitamin D deficiency, unspecified: Secondary | ICD-10-CM | POA: Diagnosis not present

## 2021-03-19 DIAGNOSIS — I4891 Unspecified atrial fibrillation: Secondary | ICD-10-CM | POA: Diagnosis not present

## 2021-03-27 ENCOUNTER — Ambulatory Visit (INDEPENDENT_AMBULATORY_CARE_PROVIDER_SITE_OTHER): Payer: Medicare HMO | Admitting: *Deleted

## 2021-03-27 DIAGNOSIS — Z0000 Encounter for general adult medical examination without abnormal findings: Secondary | ICD-10-CM

## 2021-03-27 NOTE — Patient Instructions (Signed)
Jeremy Cardenas , Thank you for taking time to come for your Medicare Wellness Visit. I appreciate your ongoing commitment to your health goals. Please review the following plan we discussed and let me know if I can assist you in the future.   Screening recommendations/referrals: Colonoscopy: no longer required Recommended yearly ophthalmology/optometry visit for glaucoma screening and checkup Recommended yearly dental visit for hygiene and checkup  Vaccinations: Influenza vaccine: Education provided Pneumococcal vaccine: Education provided Tdap vaccine: Education provided Shingles vaccine: up to date    Advanced directives: copy requested  Conditions/risks identified: na   Next appointment: 05-07-2021 @ 9:00 Dr. Ethelene Hal  Preventive Care 77 Years and Older, Male Preventive care refers to lifestyle choices and visits with your health care provider that can promote health and wellness. What does preventive care include? A yearly physical exam. This is also called an annual well check. Dental exams once or twice a year. Routine eye exams. Ask your health care provider how often you should have your eyes checked. Personal lifestyle choices, including: Daily care of your teeth and gums. Regular physical activity. Eating a healthy diet. Avoiding tobacco and drug use. Limiting alcohol use. Practicing safe sex. Taking low doses of aspirin every day. Taking vitamin and mineral supplements as recommended by your health care provider. What happens during an annual well check? The services and screenings done by your health care provider during your annual well check will depend on your age, overall health, lifestyle risk factors, and family history of disease. Counseling  Your health care provider may ask you questions about your: Alcohol use. Tobacco use. Drug use. Emotional well-being. Home and relationship well-being. Sexual activity. Eating habits. History of falls. Memory and  ability to understand (cognition). Work and work Statistician. Screening  You may have the following tests or measurements: Height, weight, and BMI. Blood pressure. Lipid and cholesterol levels. These may be checked every 5 years, or more frequently if you are over 32 years old. Skin check. Lung cancer screening. You may have this screening every year starting at age 50 if you have a 30-pack-year history of smoking and currently smoke or have quit within the past 15 years. Fecal occult blood test (FOBT) of the stool. You may have this test every year starting at age 22. Flexible sigmoidoscopy or colonoscopy. You may have a sigmoidoscopy every 5 years or a colonoscopy every 10 years starting at age 12. Prostate cancer screening. Recommendations will vary depending on your family history and other risks. Hepatitis C blood test. Hepatitis B blood test. Sexually transmitted disease (STD) testing. Diabetes screening. This is done by checking your blood sugar (glucose) after you have not eaten for a while (fasting). You may have this done every 1-3 years. Abdominal aortic aneurysm (AAA) screening. You may need this if you are a current or former smoker. Osteoporosis. You may be screened starting at age 82 if you are at high risk. Talk with your health care provider about your test results, treatment options, and if necessary, the need for more tests. Vaccines  Your health care provider may recommend certain vaccines, such as: Influenza vaccine. This is recommended every year. Tetanus, diphtheria, and acellular pertussis (Tdap, Td) vaccine. You may need a Td booster every 10 years. Zoster vaccine. You may need this after age 58. Pneumococcal 13-valent conjugate (PCV13) vaccine. One dose is recommended after age 56. Pneumococcal polysaccharide (PPSV23) vaccine. One dose is recommended after age 47. Talk to your health care provider about which screenings and vaccines  you need and how often you need  them. This information is not intended to replace advice given to you by your health care provider. Make sure you discuss any questions you have with your health care provider. Document Released: 08/11/2015 Document Revised: 04/03/2016 Document Reviewed: 05/16/2015 Elsevier Interactive Patient Education  2017 [place] Prevention in the Home Falls can cause injuries. They can happen to people of all ages. There are many things you can do to make your home safe and to help prevent falls. What can I do on the outside of my home? Regularly fix the edges of walkways and driveways and fix any cracks. Remove anything that might make you trip as you walk through a door, such as a raised step or threshold. Trim any bushes or trees on the path to your home. Use bright outdoor lighting. Clear any walking paths of anything that might make someone trip, such as rocks or tools. Regularly check to see if handrails are loose or broken. Make sure that both sides of any steps have handrails. Any raised decks and porches should have guardrails on the edges. Have any leaves, snow, or ice cleared regularly. Use sand or salt on walking paths during winter. Clean up any spills in your garage right away. This includes oil or grease spills. What can I do in the bathroom? Use night lights. Install grab bars by the toilet and in the tub and shower. Do not use towel bars as grab bars. Use non-skid mats or decals in the tub or shower. If you need to sit down in the shower, use a plastic, non-slip stool. Keep the floor dry. Clean up any water that spills on the floor as soon as it happens. Remove soap buildup in the tub or shower regularly. Attach bath mats securely with double-sided non-slip rug tape. Do not have throw rugs and other things on the floor that can make you trip. What can I do in the bedroom? Use night lights. Make sure that you have a light by your bed that is easy to reach. Do not use  any sheets or blankets that are too big for your bed. They should not hang down onto the floor. Have a firm chair that has side arms. You can use this for support while you get dressed. Do not have throw rugs and other things on the floor that can make you trip. What can I do in the kitchen? Clean up any spills right away. Avoid walking on wet floors. Keep items that you use a lot in easy-to-reach places. If you need to reach something above you, use a strong step stool that has a grab bar. Keep electrical cords out of the way. Do not use floor polish or wax that makes floors slippery. If you must use wax, use non-skid floor wax. Do not have throw rugs and other things on the floor that can make you trip. What can I do with my stairs? Do not leave any items on the stairs. Make sure that there are handrails on both sides of the stairs and use them. Fix handrails that are broken or loose. Make sure that handrails are as long as the stairways. Check any carpeting to make sure that it is firmly attached to the stairs. Fix any carpet that is loose or worn. Avoid having throw rugs at the top or bottom of the stairs. If you do have throw rugs, attach them to the floor with carpet tape. Make sure  that you have a light switch at the top of the stairs and the bottom of the stairs. If you do not have them, ask someone to add them for you. What else can I do to help prevent falls? Wear shoes that: Do not have high heels. Have rubber bottoms. Are comfortable and fit you well. Are closed at the toe. Do not wear sandals. If you use a stepladder: Make sure that it is fully opened. Do not climb a closed stepladder. Make sure that both sides of the stepladder are locked into place. Ask someone to hold it for you, if possible. Clearly mark and make sure that you can see: Any grab bars or handrails. First and last steps. Where the edge of each step is. Use tools that help you move around (mobility aids)  if they are needed. These include: Canes. Walkers. Scooters. Crutches. Turn on the lights when you go into a dark area. Replace any light bulbs as soon as they burn out. Set up your furniture so you have a clear path. Avoid moving your furniture around. If any of your floors are uneven, fix them. If there are any pets around you, be aware of where they are. Review your medicines with your doctor. Some medicines can make you feel dizzy. This can increase your chance of falling. Ask your doctor what other things that you can do to help prevent falls. This information is not intended to replace advice given to you by your health care provider. Make sure you discuss any questions you have with your health care provider. Document Released: 05/11/2009 Document Revised: 12/21/2015 Document Reviewed: 08/19/2014 Elsevier Interactive Patient Education  2017 [employer].

## 2021-03-27 NOTE — Progress Notes (Signed)
Subjective:   Jeremy Cardenas is a 75 y.o. male who presents for Medicare Annual/Subsequent preventive examination.  I connected with  Jeremy Cardenas on 03/27/21 by a telephone enabled telemedicine application and verified that I am speaking with the correct person using two identifiers.   I discussed the limitations of evaluation and management by telemedicine. The patient expressed understanding and agreed to proceed.    Review of Systems    Na Cardiac Risk Factors include: advanced age (>71mn, >>1women);male gender     Objective:    Today's Vitals   There is no height or weight on file to calculate BMI.  Advanced Directives 03/27/2021 01/03/2021 05/17/2020  Does Patient Have a Medical Advance Directive? Yes Yes No  Type of [place]? No - copy requested - -  Would patient like information on creating a medical advance directive? No - Patient declined - -    Current Medications (verified) Outpatient Encounter Medications as of 03/27/2021  Medication Sig   apixaban (ELIQUIS) 5 MG TABS tablet Take 1 tablet (5 mg total) by mouth 2 (two) times daily.   co-enzyme Q-10 30 MG capsule Take 30 mg by mouth daily.   diclofenac sodium (VOLTAREN) 1 % GEL Apply a small amount to the sore space on the knee 3 times a day as needed.   diltiazem (CARDIZEM CD) 180 MG 24 hr capsule Take 1 capsule (180 mg total) by mouth daily.   fluticasone (FLONASE) 50 MCG/ACT nasal spray Place 1 spray into both nostrils as needed.    folic acid (FOLVITE) 1 MG tablet Take 1 mg by mouth daily.   glucosamine-chondroitin 500-400 MG tablet Take 1 tablet by mouth 2 (two) times daily.   Magnesium 400 MG TABS Take 1 tablet by mouth daily.   methotrexate (RHEUMATREX) 2.5 MG tablet Take 2.5 mg by mouth. Takes 4 Tabs Every Friday   Omega-3 Fatty Acids (SALMON OIL-1000) 200 MG CAPS Take by mouth daily.   OVER THE COUNTER  MEDICATION 600 mg daily. 2 pills daily   saw palmetto 160 MG capsule Take 160 mg by mouth 2 (two) times daily.   tamsulosin (FLOMAX) 0.4 MG CAPS capsule TAKE 1 CAPSULE BY MOUTH EVERY DAY   Vitamin D, Ergocalciferol, (DRISDOL) 1.25 MG (50000 UT) CAPS capsule Take 1 capsule by mouth once a week.   No facility-administered encounter medications on file as of 03/27/2021.    Allergies (verified) Amoxicillin   History: Past Medical History:  Diagnosis Date   Colon polyps    COVID-19    Hearing loss    Hyperlipidemia    Hypothyroidism    Internal hemorrhoids    Leiomyoma    PMR (polymyalgia rheumatica) (HCC)    Sigmoid diverticulosis    Sleep apnea    CPAP   Past Surgical History:  Procedure Laterality Date   COLONOSCOPY W/ BIOPSIES     rotator cuff surgery     Family History  Problem Relation Age of Onset   Stomach cancer Mother    Heart attack Father 544      Died age 77  Colon cancer Other    Diabetes Other    Social History   Socioeconomic History   Marital status: Married    Spouse name: Not on file   Number of children: Not on file   Years of education: Not on file   Highest education level:  Not on file  Occupational History   Not on file  Tobacco Use   Smoking status: Former    Packs/day: 1.00    Years: 20.00    Pack years: 20.00    Types: Cigarettes    Quit date: 07/29/1981    Years since quitting: 39.6   Smokeless tobacco: Never  Substance and Sexual Activity   Alcohol use: Yes    Comment: occ   Drug use: No   Sexual activity: Not on file  Other Topics Concern   Not on file  Social History Narrative   Married.  [employer].     Social Determinants of Health   Financial Resource Strain: Low Risk    Difficulty of Paying Living Expenses: Not hard at all  Food Insecurity: No Food Insecurity   Worried About Charity fundraiser in the Last Year: Never true   [place] in the Last Year: Never true  Transportation Needs: No Transportation Needs    Lack of Transportation (Medical): No   Lack of Transportation (Non-Medical): No  Physical Activity: Inactive   Days of Exercise per Week: 0 days   Minutes of Exercise per Session: 0 min  Stress: No Stress Concern Present   Feeling of Stress : Not at all  Social Connections: Socially Integrated   Frequency of Communication with Friends and Family: More than three times a week   Frequency of Social Gatherings with Friends and Family: More than three times a week   Attends Religious Services: More than 4 times per year   Active Member of [employer] or Organizations: Yes   Attends Music therapist: More than 4 times per year   Marital Status: Married    Tobacco Counseling Counseling given: Not Answered   Clinical Intake:  Pre-visit preparation completed: Yes  Pain : 0-10 Pain Type: Acute pain Pain Location: Back Pain Orientation: Mid Pain Descriptors / Indicators: Aching Pain Onset: Yesterday Pain Relieving Factors: nothing  Pain Relieving Factors: nothing  Nutritional Risks: None Diabetes: No  How often do you need to have someone help you when you read instructions, pamphlets, or other written materials from your doctor or pharmacy?: 1 - Never  Diabetic?No  Interpreter Needed?: No  Information entered by :: Jeremy Kennedy LPN   Activities of Daily Living In your present state of health, do you have any difficulty performing the following activities: 03/27/2021  Hearing? Y  Vision? N  Difficulty concentrating or making decisions? N  Walking or climbing stairs? N  Doing errands, shopping? N  Preparing Food and eating ? N  Using the Toilet? N  In the past six months, have you accidently leaked urine? N  Do you have problems with loss of bowel control? N  Managing your Medications? N  Managing your Finances? N  Housekeeping or managing your Housekeeping? N  Some recent data might be hidden    Patient Care Team: Libby Maw, MD as PCP -  General (Family Medicine) Minus Breeding, MD as PCP - Cardiology (Cardiology)  Indicate any recent Medical Services you may have received from other than [facility] providers in the past year (date may be approximate).     Assessment:   This is a routine wellness examination for Jeremy Cardenas.  Hearing/Vision screen Hearing Screening - Comments:: Hearing aids both ears Vision Screening - Comments:: Up to date [facility]  Dietary issues and exercise activities discussed: Current Exercise Habits: The patient does not participate in regular exercise  at present   Goals Addressed             This Visit's Progress    Patient Stated       Loose weight 10lbs       Depression Screen PHQ 2/9 Scores 03/27/2021 12/13/2013  PHQ - 2 Score 0 0    Fall Risk Fall Risk  03/27/2021 02/02/2021 12/13/2013  Falls in the past year? 0 0 No  Number falls in past yr: 0 0 -  Injury with Fall? 0 0 -  Follow up Falls evaluation completed;Falls prevention discussed;Education provided - -    FALL RISK PREVENTION PERTAINING TO THE HOME:  Any stairs in or around the home? Yes  If so, are there any without handrails? No  Home free of loose throw rugs in walkways, pet beds, electrical cords, etc? Yes  Adequate lighting in your home to reduce risk of falls? Yes   ASSISTIVE DEVICES UTILIZED TO PREVENT FALLS:  Life alert? No  Use of a cane, walker or w/c? No  Grab bars in the bathroom? No  Shower chair or bench in shower? No  Elevated toilet seat or a handicapped toilet? No   TIMED UP AND GO:  Was the test performed? No .    Cognitive Function:  Normal cognitive status assessed by direct observation by this Nurse Health Advisor. No abnormalities found.        Immunizations Immunization History  Administered Date(s) Administered   Influenza Split 07/02/2011   Influenza Whole 06/11/2007, 05/28/2010, 04/28/2012   Influenza, High Dose Seasonal PF 05/25/2013, 04/28/2016, 05/11/2017    Pneumococcal Polysaccharide-23 05/28/2010   Td 07/29/1997, 08/22/2008    TDAP status: Due, Education has been provided regarding the importance of this vaccine. Advised may receive this vaccine at local pharmacy or Health Dept. Aware to provide a copy of the vaccination record if obtained from local pharmacy or Health Dept. Verbalized acceptance and understanding.  Flu Vaccine status: Due, Education has been provided regarding the importance of this vaccine. Advised may receive this vaccine at local pharmacy or Health Dept. Aware to provide a copy of the vaccination record if obtained from local pharmacy or Health Dept. Verbalized acceptance and understanding.  Pneumococcal vaccine status: Due, Education has been provided regarding the importance of this vaccine. Advised may receive this vaccine at local pharmacy or Health Dept. Aware to provide a copy of the vaccination record if obtained from local pharmacy or Health Dept. Verbalized acceptance and understanding.  Covid-19 vaccine status: Information provided on how to obtain vaccines.   Qualifies for Shingles Vaccine? Yes   Zostavax completed No   Shingrix Completed?: Yes  per patient. Called [facility] to have documentation faxed  Screening Tests Health Maintenance  Topic Date Due   COVID-19 Vaccine (1) Never done   Hepatitis C Screening  Never done   Zoster Vaccines- Shingrix (1 of 2) Never done   PNA vac Low Risk Adult (2 of 2 - PCV13) 05/29/2011   TETANUS/TDAP  08/22/2018   INFLUENZA VACCINE  02/26/2021   HPV VACCINES  Aged Out    Health Maintenance  Health Maintenance Due  Topic Date Due   COVID-19 Vaccine (1) Never done   Hepatitis C Screening  Never done   Zoster Vaccines- Shingrix (1 of 2) Never done   PNA vac Low Risk Adult (2 of 2 - PCV13) 05/29/2011   TETANUS/TDAP  08/22/2018   INFLUENZA VACCINE  02/26/2021    Colorectal cancer screening: No longer required.  Lung Cancer Screening: (Low Dose CT Chest recommended if  Age 84-80 years, 30 pack-year currently smoking OR have quit w/in 15years.) does not qualify.   Lung Cancer Screening Referral:  Additional Screening:  Hepatitis C Screening: does qualify  Vision Screening: Recommended annual ophthalmology exams for early detection of glaucoma and other disorders of the eye. Is the patient up to date with their annual eye exam?  Yes  Who is the provider or what is the name of the office in which the patient attends annual eye exams? [facility] If pt is not established with a provider, would they like to be referred to a provider to establish care? No .   Dental Screening: Recommended annual dental exams for proper oral hygiene  Community Resource Referral / Chronic Care Management: CRR required this visit?  No   CCM required this visit?  No      Plan:     I have personally reviewed and noted the following in the patient's chart:   Medical and social history Use of alcohol, tobacco or illicit drugs  Current medications and supplements including opioid prescriptions. Patient is not currently taking opioid prescriptions. Functional ability and status Nutritional status Physical activity Advanced directives List of other physicians Hospitalizations, surgeries, and ER visits in previous 12 months Vitals Screenings to include cognitive, depression, and falls Referrals and appointments  In addition, I have reviewed and discussed with patient certain preventive protocols, quality metrics, and best practice recommendations. A written personalized care plan for preventive services as well as general preventive health recommendations were provided to patient.     Jeremy Kennedy, LPN   [id number]   Nurse Notes: na

## 2021-03-30 DIAGNOSIS — G4733 Obstructive sleep apnea (adult) (pediatric): Secondary | ICD-10-CM | POA: Diagnosis not present

## 2021-04-03 ENCOUNTER — Ambulatory Visit: Payer: Medicare HMO | Admitting: Cardiology

## 2021-04-04 ENCOUNTER — Telehealth: Payer: Self-pay

## 2021-04-04 NOTE — Telephone Encounter (Signed)
   Cardiac Monitor Alert  Date of alert:  04/04/2021   Patient Name: Jeremy Cardenas  DOB: [birth year]  MRN: [id number]   [facility] Cardiologist: Minus Breeding, MD  [facility] EP:  None    Monitor Information: Cardiac Event Monitor [[employer]] 30 day event  Reason:  Palpitations Ordering provider:  Dr. Percival Spanish  :1}  Alert Atrial Fibrillation/Flutter This is the 2nd alert for this rhythm.  The patient has a hx of Atrial Fibrillation/Flutter.    Anticoagulation medication as of 04/04/2021           apixaban (ELIQUIS) 5 MG TABS tablet Take 1 tablet (5 mg total) by mouth 2 (two) times daily.       Next Cardiology Appointment   None scheduled   The patient could NOT be reached by telephone today.  04/04/21  Arrhythmia, symptoms and history reviewed with Dr. Margaretann Loveless.  Plan:  Per Dr. Margaretann Loveless if patient is symptomatic we can increase patients Diltiazem to '240mg'$  daily as long as patients blood pressure is greater than 130/80. Patient will also need follow up with Dr. Percival Spanish after monitor is over. Attempted to call patient but unable to reach. Left message to call back.     Other: Left message for patient to call back to office to discuss symptoms.  Will route message and Dr. Delphina Cahill recommendations to NL triage.   Chriss Driver, RN  04/04/2021 9:44 AM

## 2021-04-06 NOTE — Telephone Encounter (Signed)
Called patient, advised of note below. Patient states that he is not having any symptoms- does not wish to make any changes to his medications.  A follow up with Dr.Hochrein is needed- will notify scheduling.   And will route to Dr.Hochrein to review further.

## 2021-04-16 ENCOUNTER — Telehealth: Payer: Self-pay

## 2021-04-16 NOTE — Telephone Encounter (Signed)
Called patient left message on personal voice mail we received 2 monitor strips from 9/18 revealing fast heart beat.Calling to see if you were aware.Advised to call me back.

## 2021-04-16 NOTE — Telephone Encounter (Signed)
Spoke to patient Dr.Hochrein reviewed monitor strips.He advised no change.Continue same medications.

## 2021-04-17 ENCOUNTER — Telehealth: Payer: Self-pay | Admitting: Cardiology

## 2021-04-17 NOTE — Telephone Encounter (Signed)
   Cardiac Monitor Alert  Date of alert:  04/17/2021   Patient Name: Jeremy Cardenas  DOB: [birth year]  MRN: [id number]   [facility] Cardiologist: Minus Breeding, MD  [facility] EP:  None    Monitor Information: Cardiac Event Monitor [[employer]]  Reason:  palpitations  Ordering provider:  Dr. Percival Spanish   Alert Atrial Fibrillation/Flutter This is the 2nd alert for this rhythm - previous on 9/7 and 9/19 -- alert was auto-trigger, HR 150-160bpm -- recorded on 04/15/21 The patient has a hx of Atrial Fibrillation/Flutter.  The patient is currently on anticoagulation. Anticoagulation medication as of 04/17/2021           apixaban (ELIQUIS) 5 MG TABS tablet Take 1 tablet (5 mg total) by mouth 2 (two) times daily.       Next Cardiology Appointment None scheduled at this time  The patient could NOT be reached by telephone today.  Left message to call back Monitor report given to nurse covering Dr. Percival Spanish to be reviewed today    Fidel Levy, RN  04/17/2021 9:52 AM

## 2021-04-23 NOTE — Telephone Encounter (Signed)
Scheduled patient for appointment 05/17/21 - f/u monitor

## 2021-04-25 ENCOUNTER — Telehealth: Payer: Self-pay | Admitting: Cardiology

## 2021-04-25 DIAGNOSIS — I4819 Other persistent atrial fibrillation: Secondary | ICD-10-CM

## 2021-04-25 MED ORDER — APIXABAN 5 MG PO TABS: 5.0000 mg | ORAL_TABLET | Freq: Two times a day (BID) | ORAL | 3 refills | Status: DC

## 2021-04-25 MED ORDER — DILTIAZEM HCL ER COATED BEADS 180 MG PO CP24: 180.0000 mg | ORAL_CAPSULE | Freq: Every day | ORAL | 1 refills | Status: DC

## 2021-04-25 NOTE — Telephone Encounter (Signed)
LM for the per his DPR and I refilled his meds and advised him to keep his 05/17/21 ov appt.

## 2021-04-25 NOTE — Telephone Encounter (Signed)
   Pt c/o medication issue:  1. Name of Medication:   apixaban (ELIQUIS) 5 MG TABS tablet    diltiazem (CARDIZEM CD) 180 MG 24 hr capsule   2. How are you currently taking this medication (dosage and times per day)?   3. Are you having a reaction (difficulty breathing--STAT)?   4. What is your medication issue? Pt wants to know if he still need to be taking for these medications, if yes, he needs refill to his new pharmacy at [place].

## 2021-05-02 ENCOUNTER — Encounter: Payer: Self-pay | Admitting: *Deleted

## 2021-05-07 ENCOUNTER — Ambulatory Visit: Payer: Medicare HMO | Admitting: Family Medicine

## 2021-05-07 DIAGNOSIS — B370 Candidal stomatitis: Secondary | ICD-10-CM | POA: Diagnosis not present

## 2021-05-07 DIAGNOSIS — L821 Other seborrheic keratosis: Secondary | ICD-10-CM | POA: Diagnosis not present

## 2021-05-10 ENCOUNTER — Telehealth: Payer: Self-pay | Admitting: *Deleted

## 2021-05-10 NOTE — Chronic Care Management (AMB) (Signed)
  Chronic Care Management   Note  05/10/2021 Name: Jeremy Cardenas MRN: [id number] DOB: 23-Sep-1945  Jeremy Cardenas is a 75 y.o. year old male who is a primary care patient of Libby Maw, MD. I reached out to [name] by phone today in response to a referral sent by Jeremy Cardenas's PCP.  Jeremy Cardenas was given information about Chronic Care Management services today including:  CCM service includes personalized support from designated clinical staff supervised by his physician, including individualized plan of care and coordination with other care providers 24/7 contact phone numbers for assistance for urgent and routine care needs. Service will only be billed when office clinical staff spend 20 minutes or more in a month to coordinate care. Only one practitioner may furnish and bill the service in a calendar month. The patient may stop CCM services at any time (effective at the end of the month) by phone call to the office staff. The patient is responsible for co-pay (up to 20% after annual deductible is met) if co-pay is required by the individual health plan.   Patient did not agree to enrollment in care management services and does not wish to consider at this time.  Follow up plan: Patient declines further follow up and engagement by the care management team. Appropriate care team members and provider have been notified via electronic communication.   Julian Hy, [place] Management  Direct Dial: [phone]

## 2021-05-16 ENCOUNTER — Encounter: Payer: Self-pay | Admitting: Cardiology

## 2021-05-16 NOTE — Progress Notes (Signed)
[facility]    Cardiology Office Note   Date:  05/17/2021   ID:  Jeremy Cardenas, DOB Jul 25, 1946, MRN [id number]  PCP:  Libby Maw, MD  Cardiologist:   Minus Breeding, MD   Chief Complaint  Patient presents with   Atrial Fibrillation      History of Present Illness: Jeremy Cardenas is a 75 y.o. male who is I am seeing back for evaluation of palpitations.  He was in the emergency room in June for this.  There was no clear etiology identified.  Chest x-ray was unremarkable.   After the first appt I sent him for a POET (Plain Old Exercise Treadmill) to evaluate SOB.  However, when he presented for this he was in atrial fib.  He saw Dr. Stanford Breed.   He was started Eliquis and Cardizem and sent for an echo which was unremarkable.    Of note an echocardiogram demonstrated no significant abnormalities with a well-preserved ejection fraction.  I did send him for a monitor which demonstrated paroxysmal fib or flutter.  Rate was controlled.  Run that was the longest lasted 5 days.  It looks like he was in A. fib about 27% of the time.  He did not report symptoms.  Since I last saw him he has had no new cardiovascular complaints. The patient denies any new symptoms such as chest discomfort, neck or arm discomfort. There has been no new shortness of breath, PND or orthopnea. There have been no reported palpitations, presyncope or syncope.   He does have some mild chronic shortness of breath that is better than previous.  He says he has been fatigued and has had this since COVID but it is slowly improved.    Past Medical History:  Diagnosis Date   Colon polyps    COVID-19    Hearing loss    Hyperlipidemia    Hypothyroidism    Internal hemorrhoids    Leiomyoma    PAF (paroxysmal atrial fibrillation) (HCC)    PMR (polymyalgia rheumatica) (HCC)    Sigmoid diverticulosis    Sleep apnea    CPAP    Past Surgical History:  Procedure Laterality Date   COLONOSCOPY W/ BIOPSIES     rotator  cuff surgery       Current Outpatient Medications  Medication Sig Dispense Refill   apixaban (ELIQUIS) 5 MG TABS tablet Take 1 tablet (5 mg total) by mouth 2 (two) times daily. 180 tablet 3   co-enzyme Q-10 30 MG capsule Take 30 mg by mouth daily.     diclofenac sodium (VOLTAREN) 1 % GEL Apply a small amount to the sore space on the knee 3 times a day as needed. 200 g 2   diltiazem (CARDIZEM CD) 180 MG 24 hr capsule Take 1 capsule (180 mg total) by mouth daily. 90 capsule 1   fluticasone (FLONASE) 50 MCG/ACT nasal spray Place 1 spray into both nostrils as needed.      folic acid (FOLVITE) 1 MG tablet Take 1 mg by mouth daily.     glucosamine-chondroitin 500-400 MG tablet Take 1 tablet by mouth 2 (two) times daily.     Magnesium 400 MG TABS Take 1 tablet by mouth daily.     methotrexate (RHEUMATREX) 2.5 MG tablet Take 2.5 mg by mouth. Takes 4 Tabs Every Friday     Omega-3 Fatty Acids (SALMON OIL-1000) 200 MG CAPS Take by mouth daily.     OVER THE COUNTER MEDICATION 600 mg daily. 2 pills  daily     saw palmetto 160 MG capsule Take 160 mg by mouth 2 (two) times daily.     Vitamin D, Ergocalciferol, (DRISDOL) 1.25 MG (50000 UT) CAPS capsule Take 1 capsule by mouth once a week.     tamsulosin (FLOMAX) 0.4 MG CAPS capsule Take 1 capsule (0.4 mg total) by mouth daily. 90 capsule 0   No current facility-administered medications for this visit.    Allergies:   Amoxicillin    ROS:  Please see the history of present illness.   Otherwise, review of systems are positive for none .   All other systems are reviewed and negative.    PHYSICAL EXAM: VS:  BP (!) 116/50 (BP Location: Left Arm)   Pulse (!) 52   Ht 5\' 9"  (1.753 m)   Wt 213 lb 9.6 oz (96.9 kg)   SpO2 99%   BMI 31.54 kg/m  , BMI Body mass index is 31.54 kg/m. GENERAL:  Well appearing NECK:  No jugular venous distention, waveform within normal limits, carotid upstroke brisk and symmetric, no bruits, no thyromegaly LUNGS:  Clear to  auscultation bilaterally CHEST:  Unremarkable HEART:  PMI not displaced or sustained,S1 and S2 within normal limits, no S3, no S4, no clicks, no rubs, no murmurs ABD:  Flat, positive bowel sounds normal in frequency in pitch, no bruits, no rebound, no guarding, no midline pulsatile mass, no hepatomegaly, no splenomegaly EXT:  2 plus pulses throughout, no edema, no cyanosis no clubbing   EKG:  EKG is not ordered today.   Recent Labs: 02/05/2021: ALT 32; BUN 16; Creatinine, Ser 0.77; Hemoglobin 14.1; Platelets 157.0; Potassium 4.2; Sodium 138 02/09/2021: TSH 1.870    Lipid Panel    Component Value Date/Time   CHOL 144 02/05/2021 0819   TRIG 130.0 02/05/2021 0819   HDL 34.90 (L) 02/05/2021 0819   CHOLHDL 4 02/05/2021 0819   VLDL 26.0 02/05/2021 0819   LDLCALC 83 02/05/2021 0819      Wt Readings from Last 3 Encounters:  05/17/21 213 lb 9.6 oz (96.9 kg)  03/15/21 207 lb 9.6 oz (94.2 kg)  02/09/21 207 lb 12.8 oz (94.3 kg)      Other studies Reviewed: Additional studies/ records that were reviewed today include: Monitor Review of the above records demonstrates:  Please see elsewhere in the note.     ASSESSMENT AND PLAN:   ATRIAL FIB:   This is paroxysmal.  He has been  Mr. Jeremy Cardenas has a CHA2DS2 - VASc score of 2.  This is not sustained and rate is controlled.  They are going to get a wearable so he can better track his heart rate.  He tolerates anticoagulation.  At this point I do not think he needs rhythm management.  We had a long discussion again about rate control and anticoagulation.    Current medicines are reviewed at length with the patient today.  The patient does not have concerns regarding medicines.  The following changes have been made:  no change  Labs/ tests ordered today include: None  No orders of the defined types were placed in this encounter.      Disposition:   FU with APP in six months.    Signed, Minus Breeding, MD  05/17/2021  3:30 PM    [place]

## 2021-05-17 ENCOUNTER — Other Ambulatory Visit: Payer: Self-pay

## 2021-05-17 ENCOUNTER — Encounter: Payer: Self-pay | Admitting: Cardiology

## 2021-05-17 ENCOUNTER — Ambulatory Visit: Payer: Medicare HMO | Admitting: Cardiology

## 2021-05-17 VITALS — BP 116/50 | HR 52 | Ht 69.0 in | Wt 213.6 lb

## 2021-05-17 DIAGNOSIS — I480 Paroxysmal atrial fibrillation: Secondary | ICD-10-CM

## 2021-05-17 DIAGNOSIS — N401 Enlarged prostate with lower urinary tract symptoms: Secondary | ICD-10-CM | POA: Diagnosis not present

## 2021-05-17 DIAGNOSIS — R3911 Hesitancy of micturition: Secondary | ICD-10-CM

## 2021-05-17 DIAGNOSIS — R0602 Shortness of breath: Secondary | ICD-10-CM | POA: Diagnosis not present

## 2021-05-17 MED ORDER — TAMSULOSIN HCL 0.4 MG PO CAPS: 0.4000 mg | ORAL_CAPSULE | Freq: Every day | ORAL | 0 refills | Status: AC

## 2021-05-17 NOTE — Patient Instructions (Signed)
Medication Instructions:  No changes *If you need a refill on your cardiac medications before your next appointment, please call your pharmacy*   Lab Work: None ordered If you have labs (blood work) drawn today and your tests are completely normal, you will receive your results only by: MyChart Message (if you have MyChart) OR A paper copy in the mail If you have any lab test that is abnormal or we need to change your treatment, we will call you to review the results.   Testing/Procedures: None ordered   Follow-Up: At [facility], you and your health needs are our priority.  As part of our continuing mission to provide you with exceptional heart care, we have created designated Provider Care Teams.  These Care Teams include your primary Cardiologist (physician) and Advanced Practice Providers (APPs -  Physician Assistants and Nurse Practitioners) who all work together to provide you with the care you need, when you need it.  We recommend signing up for the patient portal called "MyChart".  Sign up information is provided on this After Visit Summary.  MyChart is used to connect with patients for Virtual Visits (Telemedicine).  Patients are able to view lab/test results, encounter notes, upcoming appointments, etc.  Non-urgent messages can be sent to your provider as well.   To learn more about what you can do with MyChart, go to [email or web address].    Your next appointment:   6 month(s)  The format for your next appointment:   In Person  Provider:   You will see one of the following Advanced Practice Providers on your designated Care Team:   Rhonda Barrett, PA-C Jennifer, Lambert, PA-C Kathryn Lawrence, DNP, ANP    

## 2021-06-18 DIAGNOSIS — Z79899 Other long term (current) drug therapy: Secondary | ICD-10-CM | POA: Diagnosis not present

## 2021-06-18 DIAGNOSIS — E559 Vitamin D deficiency, unspecified: Secondary | ICD-10-CM | POA: Diagnosis not present

## 2021-06-18 DIAGNOSIS — M353 Polymyalgia rheumatica: Secondary | ICD-10-CM | POA: Diagnosis not present

## 2021-06-18 DIAGNOSIS — M1711 Unilateral primary osteoarthritis, right knee: Secondary | ICD-10-CM | POA: Diagnosis not present

## 2021-06-18 DIAGNOSIS — I4891 Unspecified atrial fibrillation: Secondary | ICD-10-CM | POA: Diagnosis not present

## 2021-06-26 DIAGNOSIS — I4891 Unspecified atrial fibrillation: Secondary | ICD-10-CM | POA: Diagnosis not present

## 2021-06-26 DIAGNOSIS — Z0000 Encounter for general adult medical examination without abnormal findings: Secondary | ICD-10-CM | POA: Diagnosis not present

## 2021-06-26 DIAGNOSIS — N401 Enlarged prostate with lower urinary tract symptoms: Secondary | ICD-10-CM | POA: Diagnosis not present

## 2021-06-26 DIAGNOSIS — R3911 Hesitancy of micturition: Secondary | ICD-10-CM | POA: Diagnosis not present

## 2021-06-26 DIAGNOSIS — N529 Male erectile dysfunction, unspecified: Secondary | ICD-10-CM | POA: Diagnosis not present

## 2021-06-26 DIAGNOSIS — Z2820 Immunization not carried out because of patient decision for unspecified reason: Secondary | ICD-10-CM | POA: Diagnosis not present

## 2021-06-26 DIAGNOSIS — Z7901 Long term (current) use of anticoagulants: Secondary | ICD-10-CM | POA: Diagnosis not present

## 2021-06-26 DIAGNOSIS — Z136 Encounter for screening for cardiovascular disorders: Secondary | ICD-10-CM | POA: Diagnosis not present

## 2021-07-03 DIAGNOSIS — Z136 Encounter for screening for cardiovascular disorders: Secondary | ICD-10-CM | POA: Diagnosis not present

## 2021-08-01 DIAGNOSIS — G4733 Obstructive sleep apnea (adult) (pediatric): Secondary | ICD-10-CM | POA: Diagnosis not present

## 2021-08-15 DIAGNOSIS — G4733 Obstructive sleep apnea (adult) (pediatric): Secondary | ICD-10-CM | POA: Diagnosis not present

## 2021-09-04 DIAGNOSIS — I4891 Unspecified atrial fibrillation: Secondary | ICD-10-CM | POA: Diagnosis not present

## 2021-09-04 DIAGNOSIS — M1711 Unilateral primary osteoarthritis, right knee: Secondary | ICD-10-CM | POA: Diagnosis not present

## 2021-09-04 DIAGNOSIS — Z79899 Other long term (current) drug therapy: Secondary | ICD-10-CM | POA: Diagnosis not present

## 2021-09-04 DIAGNOSIS — E559 Vitamin D deficiency, unspecified: Secondary | ICD-10-CM | POA: Diagnosis not present

## 2021-09-04 DIAGNOSIS — M353 Polymyalgia rheumatica: Secondary | ICD-10-CM | POA: Diagnosis not present

## 2021-10-24 ENCOUNTER — Other Ambulatory Visit: Payer: Self-pay | Admitting: Cardiology

## 2021-10-24 DIAGNOSIS — I4819 Other persistent atrial fibrillation: Secondary | ICD-10-CM

## 2021-11-13 DIAGNOSIS — G4733 Obstructive sleep apnea (adult) (pediatric): Secondary | ICD-10-CM | POA: Diagnosis not present

## 2021-11-30 NOTE — Progress Notes (Signed)
? ?Cardiology Clinic Note  ? ?Patient Name: Jeremy Cardenas ?Date of Encounter: 12/03/2021 ? ?Primary Care Provider:  Libby Maw, MD ?Primary Cardiologist:  Minus Breeding, MD ? ?Patient Profile  ?  ?Jeremy Cardenas presents to the clinic today for follow-up evaluation of his paroxysmal atrial fibrillation and shortness of breath. ? ?Past Medical History  ?  ?Past Medical History:  ?Diagnosis Date  ? Colon polyps   ? COVID-19   ? Hearing loss   ? Hyperlipidemia   ? Hypothyroidism   ? Internal hemorrhoids   ? Leiomyoma   ? PAF (paroxysmal atrial fibrillation) ([place])   ? PMR (polymyalgia rheumatica) ([facility])   ? Sigmoid diverticulosis   ? Sleep apnea   ? CPAP  ? ?Past Surgical History:  ?Procedure Laterality Date  ? COLONOSCOPY W/ BIOPSIES    ? rotator cuff surgery    ? ? ?Allergies ? ?Allergies  ?Allergen Reactions  ? Amoxicillin Rash  ? ? ?History of Present Illness  ?  ?Jeremy Cardenas has a PMH of palpitations, paroxysmal atrial fibrillation, shortness of breath, and BPH.  He presented to the emergency department in June with palpitations.  The etiology was unclear.  His checks x-ray was unremarkable at that time.  A exercise stress test was planned.  However, when he presented for the exam he was noted to be in atrial fibrillation.  He was started on Eliquis and Cardizem.  His echocardiogram was unremarkable and showed normal LVEF. ? ?He was seen in follow-up by Dr. Percival Spanish 05/17/2021.  During that time his cardiac event monitor was reviewed which showed paroxysmal atrial fibrillation/flutter.  This was rate controlled.  He was noted to have atrial fibrillation 27% of the time.  He denied symptoms.  He continued to do well and denied cardiac complaints.  He denied shortness of breath, PND and orthopnea.  He denied palpitations, presyncope and syncope.  He noted mild chronic shortness of breath that was somewhat improved.  He reported having the symptoms since contracting COVID but noted slow  improvement. ? ?He presents to the clinic today for follow-up evaluation states he has noticed occasional episodes of brief alerts from his [place].  He does not feel palpitations.  He reports that the episodes happen less than 1 time per week.  They usually happen between 830 and 9 PM in the evening.  He reports compliance with his diltiazem and apixaban medication.  We reviewed triggers for palpitations.  He reports compliance with his CPAP.  We reviewed the pathophysiology of atrial fibrillation.  His wife expressed understanding.  We also reviewed his previous echocardiogram.  His EKG today shows sinus bradycardia 55 bpm.  We will plan follow-up in 6 to 9 months, provide triggers to avoid for atrial fibrillation, and have him maintain his physical activity. ? ?Today he denies chest pain, shortness of breath, lower extremity edema, fatigue, palpitations, melena, hematuria, hemoptysis, diaphoresis, weakness, presyncope, syncope, orthopnea, and PND. ? ? ?Home Medications  ?  ?Prior to Admission medications   ?Medication Sig Start Date End Date Taking? Authorizing Provider  ?apixaban (ELIQUIS) 5 MG TABS tablet Take 1 tablet (5 mg total) by mouth 2 (two) times daily. 04/25/21   Minus Breeding, MD  ?co-enzyme Q-10 30 MG capsule Take 30 mg by mouth daily.    [provider]  ?diclofenac sodium (VOLTAREN) 1 % GEL Apply a small amount to the sore space on the knee 3 times a day as needed. 09/18/18   Ethelene Hal,  Mortimer Fries, MD  ?diltiazem (CARDIZEM CD) 180 MG 24 hr capsule Take 1 capsule (180 mg total) by mouth daily. 10/24/21   Minus Breeding, MD  ?fluticasone (FLONASE) 50 MCG/ACT nasal spray Place 1 spray into both nostrils as needed.     [provider]  ?folic acid (FOLVITE) 1 MG tablet Take 1 mg by mouth daily. 01/15/19   [provider]  ?glucosamine-chondroitin 500-400 MG tablet Take 1 tablet by mouth 2 (two) times daily.    [provider]  ?Magnesium 400 MG TABS Take 1 tablet  by mouth daily.    [provider]  ?methotrexate (RHEUMATREX) 2.5 MG tablet Take 2.5 mg by mouth. Takes 4 Tabs Every Friday 02/07/19   [provider]  ?Omega-3 Fatty Acids (SALMON OIL-1000) 200 MG CAPS Take by mouth daily.    [provider]  ?OVER THE COUNTER MEDICATION 600 mg daily. 2 pills daily    [provider]  ?saw palmetto 160 MG capsule Take 160 mg by mouth 2 (two) times daily.    [provider]  ?tamsulosin (FLOMAX) 0.4 MG CAPS capsule Take 1 capsule (0.4 mg total) by mouth daily. 05/17/21   Minus Breeding, MD  ?Vitamin D, Ergocalciferol, (DRISDOL) 1.25 MG (50000 UT) CAPS capsule Take 1 capsule by mouth once a week. 01/01/19   [provider]  ? ? ?Family History  ?  ?Family History  ?Problem Relation Age of Onset  ? Stomach cancer Mother   ? Heart attack Father 25  ?     Died age 45  ? Colon cancer Other   ? Diabetes Other   ? ?He indicated that his mother is deceased. He indicated that his father is deceased. ? ?Social History  ?  ?Social History  ? ?Socioeconomic History  ? Marital status: Married  ?  Spouse name: Not on file  ? Number of children: Not on file  ? Years of education: Not on file  ? Highest education level: Not on file  ?Occupational History  ? Not on file  ?Tobacco Use  ? Smoking status: Former  ?  Packs/day: 1.00  ?  Years: 20.00  ?  Pack years: 20.00  ?  Types: Cigarettes  ?  Quit date: 07/29/1981  ?  Years since quitting: 40.3  ? Smokeless tobacco: Never  ?Substance and Sexual Activity  ? Alcohol use: Yes  ?  Comment: occ  ? Drug use: No  ? Sexual activity: Not on file  ?Other Topics Concern  ? Not on file  ?Social History Narrative  ? Married.  [employer].    ? ?Social Determinants of Health  ? ?Financial Resource Strain: Low Risk   ? Difficulty of Paying Living Expenses: Not hard at all  ?Food Insecurity: No Food Insecurity  ? Worried About Charity fundraiser in the Last Year: Never true  ? Ran Out of Food in the Last Year:  Never true  ?Transportation Needs: No Transportation Needs  ? Lack of Transportation (Medical): No  ? Lack of Transportation (Non-Medical): No  ?Physical Activity: Inactive  ? Days of Exercise per Week: 0 days  ? Minutes of Exercise per Session: 0 min  ?Stress: No Stress Concern Present  ? Feeling of Stress : Not at all  ?Social Connections: Socially Integrated  ? Frequency of Communication with Friends and Family: More than three times a week  ? Frequency of Social Gatherings with Friends and Family: More than three times a  week  ? Attends Religious Services: More than 4 times per year  ? Active Member of Clubs or Organizations: Yes  ? Attends Archivist Meetings: More than 4 times per year  ? Marital Status: Married  ?Intimate Partner Violence: Not At Risk  ? Fear of Current or Ex-Partner: No  ? Emotionally Abused: No  ? Physically Abused: No  ? Sexually Abused: No  ?  ? ?Review of Systems  ?  ?General:  No chills, fever, night sweats or weight changes.  ?Cardiovascular:  No chest pain, dyspnea on exertion, edema, orthopnea, palpitations, paroxysmal nocturnal dyspnea. ?Dermatological: No rash, lesions/masses ?Respiratory: No cough, dyspnea ?Urologic: No hematuria, dysuria ?Abdominal:   No nausea, vomiting, diarrhea, bright red blood per rectum, melena, or hematemesis ?Neurologic:  No visual changes, wkns, changes in mental status. ?All other systems reviewed and are otherwise negative except as noted above. ? ?Physical Exam  ?  ?VS:  BP (!) 114/52   Pulse (!) 55   Ht '5\' 9"'$  (1.753 m)   Wt 207 lb 12.8 oz (94.3 kg)   SpO2 94%   BMI 30.69 kg/m?  , BMI Body mass index is 30.69 kg/m?. ?GEN: Well nourished, well developed, in no acute distress. ?HEENT: normal. ?Neck: Supple, no JVD, carotid bruits, or masses. ?Cardiac: RRR, no murmurs, rubs, or gallops. No clubbing, cyanosis, generalized bilateral nonpitting ankle edema.  Radials/DP/PT 2+ and equal bilaterally.  ?Respiratory:  Respirations regular and  unlabored, clear to auscultation bilaterally. ?GI: Soft, nontender, nondistended, BS + x 4. ?MS: no deformity or atrophy. ?Skin: warm and dry, no rash. ?Neuro:  Strength and sensation are intact. ?Psych: Normal affect.

## 2021-12-03 ENCOUNTER — Ambulatory Visit: Payer: Medicare HMO | Admitting: General Practice

## 2021-12-03 ENCOUNTER — Encounter: Payer: Self-pay | Admitting: General Practice

## 2021-12-03 VITALS — BP 114/52 | HR 55 | Ht 69.0 in | Wt 207.8 lb

## 2021-12-03 DIAGNOSIS — G4733 Obstructive sleep apnea (adult) (pediatric): Secondary | ICD-10-CM | POA: Diagnosis not present

## 2021-12-03 DIAGNOSIS — I480 Paroxysmal atrial fibrillation: Secondary | ICD-10-CM

## 2021-12-03 DIAGNOSIS — E7800 Pure hypercholesterolemia, unspecified: Secondary | ICD-10-CM | POA: Diagnosis not present

## 2021-12-03 NOTE — Patient Instructions (Signed)
Medication Instructions:  ?Your physician recommends that you continue on your current medications as directed. Please refer to the Current Medication list given to you today. ?*If you need a refill on your cardiac medications before your next appointment, please call your pharmacy* ? ? ?Lab Work: ?NONE ORDERED ? ? ?Testing/Procedures: ?NONE ORDERED ? ? ?Follow-Up: ?At [facility], you and your health needs are our priority.  As part of our continuing mission to provide you with exceptional heart care, we have created designated Provider Care Teams.  These Care Teams include your primary Cardiologist (physician) and Advanced Practice Providers (APPs -  Physician Assistants and Nurse Practitioners) who all work together to provide you with the care you need, when you need it. ? ?We recommend signing up for the patient portal called "MyChart".  Sign up information is provided on this After Visit Summary.  MyChart is used to connect with patients for Virtual Visits (Telemedicine).  Patients are able to view lab/test results, encounter notes, upcoming appointments, etc.  Non-urgent messages can be sent to your provider as well.   ?To learn more about what you can do with MyChart, go to [email or web address].   ? ?Your next appointment:   ?6-9 month(s) ? ?The format for your next appointment:   ?In Person ? ?Provider:   ?Minus Breeding, MD or Coletta Memos, NP  ? ? ?Other Instructions ?AVOID A FIB TRIGGER SUCH AS CHOCOLATE , COFFEE, ALCOHOL  ?STAY HYDRATED ?WEAR YOUR CPAP EVERY NIGHT ?MAINTAIN PHYSICAL ACTIVITY ? ?Important Information About Sugar ? ? ? ? ?    ?

## 2021-12-04 DIAGNOSIS — E559 Vitamin D deficiency, unspecified: Secondary | ICD-10-CM | POA: Diagnosis not present

## 2021-12-04 DIAGNOSIS — Z79899 Other long term (current) drug therapy: Secondary | ICD-10-CM | POA: Diagnosis not present

## 2021-12-04 DIAGNOSIS — M353 Polymyalgia rheumatica: Secondary | ICD-10-CM | POA: Diagnosis not present

## 2021-12-04 DIAGNOSIS — M1711 Unilateral primary osteoarthritis, right knee: Secondary | ICD-10-CM | POA: Diagnosis not present

## 2021-12-04 DIAGNOSIS — I4891 Unspecified atrial fibrillation: Secondary | ICD-10-CM | POA: Diagnosis not present

## 2022-03-12 DIAGNOSIS — U071 COVID-19: Secondary | ICD-10-CM | POA: Diagnosis not present

## 2022-03-15 DIAGNOSIS — G4733 Obstructive sleep apnea (adult) (pediatric): Secondary | ICD-10-CM | POA: Diagnosis not present

## 2022-03-18 DIAGNOSIS — H2513 Age-related nuclear cataract, bilateral: Secondary | ICD-10-CM | POA: Diagnosis not present

## 2022-03-18 DIAGNOSIS — H52203 Unspecified astigmatism, bilateral: Secondary | ICD-10-CM | POA: Diagnosis not present

## 2022-03-18 DIAGNOSIS — H25013 Cortical age-related cataract, bilateral: Secondary | ICD-10-CM | POA: Diagnosis not present

## 2022-03-18 DIAGNOSIS — H43813 Vitreous degeneration, bilateral: Secondary | ICD-10-CM | POA: Diagnosis not present

## 2022-03-18 DIAGNOSIS — H524 Presbyopia: Secondary | ICD-10-CM | POA: Diagnosis not present

## 2022-03-18 DIAGNOSIS — H1789 Other corneal scars and opacities: Secondary | ICD-10-CM | POA: Diagnosis not present

## 2022-03-20 ENCOUNTER — Telehealth: Payer: Self-pay | Admitting: Family Medicine

## 2022-03-20 NOTE — Telephone Encounter (Signed)
I called patient to schedule his AWV.  Patient said he wasn't interested because he was seeing a new PCP.  Patient didn't want to give me the name of the new PCP. Please remove PCP.

## 2022-03-26 DIAGNOSIS — I4891 Unspecified atrial fibrillation: Secondary | ICD-10-CM | POA: Diagnosis not present

## 2022-03-26 DIAGNOSIS — M353 Polymyalgia rheumatica: Secondary | ICD-10-CM | POA: Diagnosis not present

## 2022-03-26 DIAGNOSIS — M1711 Unilateral primary osteoarthritis, right knee: Secondary | ICD-10-CM | POA: Diagnosis not present

## 2022-03-26 DIAGNOSIS — E559 Vitamin D deficiency, unspecified: Secondary | ICD-10-CM | POA: Diagnosis not present

## 2022-03-26 DIAGNOSIS — Z79899 Other long term (current) drug therapy: Secondary | ICD-10-CM | POA: Diagnosis not present

## 2022-04-25 DIAGNOSIS — Z1211 Encounter for screening for malignant neoplasm of colon: Secondary | ICD-10-CM | POA: Diagnosis not present

## 2022-04-25 DIAGNOSIS — Z6831 Body mass index (BMI) 31.0-31.9, adult: Secondary | ICD-10-CM | POA: Diagnosis not present

## 2022-04-25 DIAGNOSIS — R0602 Shortness of breath: Secondary | ICD-10-CM | POA: Diagnosis not present

## 2022-04-25 DIAGNOSIS — M545 Low back pain, unspecified: Secondary | ICD-10-CM | POA: Diagnosis not present

## 2022-04-25 DIAGNOSIS — M353 Polymyalgia rheumatica: Secondary | ICD-10-CM | POA: Diagnosis not present

## 2022-04-25 DIAGNOSIS — Z79899 Other long term (current) drug therapy: Secondary | ICD-10-CM | POA: Diagnosis not present

## 2022-04-25 DIAGNOSIS — I480 Paroxysmal atrial fibrillation: Secondary | ICD-10-CM | POA: Diagnosis not present

## 2022-04-25 DIAGNOSIS — M1711 Unilateral primary osteoarthritis, right knee: Secondary | ICD-10-CM | POA: Diagnosis not present

## 2022-04-25 DIAGNOSIS — R7309 Other abnormal glucose: Secondary | ICD-10-CM | POA: Diagnosis not present

## 2022-04-25 DIAGNOSIS — I4891 Unspecified atrial fibrillation: Secondary | ICD-10-CM | POA: Diagnosis not present

## 2022-04-25 DIAGNOSIS — G8929 Other chronic pain: Secondary | ICD-10-CM | POA: Diagnosis not present

## 2022-04-30 DIAGNOSIS — Z87891 Personal history of nicotine dependence: Secondary | ICD-10-CM | POA: Diagnosis not present

## 2022-04-30 DIAGNOSIS — R0602 Shortness of breath: Secondary | ICD-10-CM | POA: Diagnosis not present

## 2022-05-08 ENCOUNTER — Telehealth: Payer: Self-pay | Admitting: *Deleted

## 2022-05-08 NOTE — Telephone Encounter (Signed)
   Pre-operative Risk Assessment    Patient Name: Jeremy Cardenas  DOB: 1946/07/14 MRN: [id number]      Request for Surgical Clearance    Procedure:   COLONOSCOPY  Date of Surgery:  Clearance TBD                                 Surgeon:  DR. BADREDDINE Surgeon's Group or Practice Name:  [facility] Phone number:  [phone] Fax number:  [phone]   Type of Clearance Requested:   - Pharmacy:  Hold Apixaban (Eliquis) X'S 2-3 DAYS   Type of Anesthesia:  Not Indicated   Additional requests/questions:    Astrid Divine   05/08/2022, 7:53 AM

## 2022-05-09 DIAGNOSIS — J029 Acute pharyngitis, unspecified: Secondary | ICD-10-CM | POA: Diagnosis not present

## 2022-05-09 DIAGNOSIS — Z1152 Encounter for screening for COVID-19: Secondary | ICD-10-CM | POA: Diagnosis not present

## 2022-05-09 DIAGNOSIS — R059 Cough, unspecified: Secondary | ICD-10-CM | POA: Diagnosis not present

## 2022-05-09 DIAGNOSIS — R509 Fever, unspecified: Secondary | ICD-10-CM | POA: Diagnosis not present

## 2022-05-09 DIAGNOSIS — Z20822 Contact with and (suspected) exposure to covid-19: Secondary | ICD-10-CM | POA: Diagnosis not present

## 2022-05-15 NOTE — Telephone Encounter (Signed)
   Patient Name: Jeremy Cardenas  DOB: 1945/11/24 MRN: [id number]  Primary Cardiologist: Minus Breeding, MD  Clinical pharmacists have reviewed the patient's past medical history, labs, and current medications as part of preoperative protocol coverage. The following recommendations have been made:  Patient with diagnosis of atrial fibrillation on Eliquis for anticoagulation.     Procedure: colonoscopy Date of procedure: TBD     CHA2DS2-VASc Score = 2   This indicates a 2.2% annual risk of stroke. The patient's score is based upon: CHF History: 0 HTN History: 0 Diabetes History: 0 Stroke History: 0 Vascular Disease History: 0 Age Score: 2 Gender Score: 0   CrCl 72 Platelet count 183   Per office protocol, patient can hold Eliquis for 2 days prior to procedure.   Patient will not need bridging with Lovenox (enoxaparin) around procedure.  Please resume Eliquis as soon as possible postprocedure, at the discretion of the surgeon.   I will route this recommendation to the requesting party via Epic fax function and remove from pre-op pool.  Please call with questions.  Lenna Sciara, NP 05/15/2022, 11:28 AM

## 2022-05-15 NOTE — Telephone Encounter (Signed)
Patient with diagnosis of atrial fibrillation on Eliquis for anticoagulation.    Procedure: colonoscopy Date of procedure: TBD   CHA2DS2-VASc Score = 2   This indicates a 2.2% annual risk of stroke. The patient's score is based upon: CHF History: 0 HTN History: 0 Diabetes History: 0 Stroke History: 0 Vascular Disease History: 0 Age Score: 2 Gender Score: 0   CrCl 72 Platelet count 183  Per office protocol, patient can hold Eliquis for 2 days prior to procedure.   Patient will not need bridging with Lovenox (enoxaparin) around procedure.  **This guidance is not considered finalized until pre-operative APP has relayed final recommendations.**

## 2022-06-09 NOTE — Progress Notes (Unsigned)
Small    Cardiology Office Note   Date:  06/10/2022   ID:  Jeremy Cardenas, DOB 01-12-1946, MRN [id number]  PCP:  Patrecia Pour, Christean Grief, MD  Cardiologist:   Minus Breeding, MD   Chief Complaint  Patient presents with   Atrial Fibrillation      History of Present Illness: Jeremy Cardenas is a 76 y.o. male who is I am seeing back for evaluation of palpitations.  He was in the emergency room in June 2022 for this.  There was no clear etiology identified.  Chest x-ray was unremarkable.   After the first appt I sent him for a POET (Plain Old Exercise Treadmill) to evaluate SOB.  However, when he presented for this he was in atrial fib.  He saw Dr. Stanford Breed.   He was started Eliquis and Cardizem and sent for an echo which was unremarkable.    Monitor demonstrated paroxysmal fib or flutter.  Rate was controlled.    Since I last saw him he is done okay.  He does not think he has had any symptomatic paroxysms.  He denies any chest pressure, neck or arm discomfort.  He does a lot of work out in the yard.  His wife says he is fatigued and he likes to take naps.  However, there is no correlation of any symptoms with fibrillation.  He tolerates anticoagulation.   Past Medical History:  Diagnosis Date   Colon polyps    COVID-19    Hearing loss    Hyperlipidemia    Hypothyroidism    Internal hemorrhoids    Leiomyoma    PAF (paroxysmal atrial fibrillation) ([facility])    PMR (polymyalgia rheumatica) ([facility])    Sigmoid diverticulosis    Sleep apnea    CPAP    Past Surgical History:  Procedure Laterality Date   COLONOSCOPY W/ BIOPSIES     rotator cuff surgery       Current Outpatient Medications  Medication Sig Dispense Refill   apixaban (ELIQUIS) 5 MG TABS tablet Take 1 tablet (5 mg total) by mouth 2 (two) times daily. 180 tablet 3   co-enzyme Q-10 30 MG capsule Take 30 mg by mouth daily.     diltiazem (CARDIZEM CD) 180 MG 24 hr capsule Take 1 capsule (180 mg total) by mouth daily. 90  capsule 2   folic acid (FOLVITE) 1 MG tablet Take 1 mg by mouth daily.     glucosamine-chondroitin 500-400 MG tablet Take 1 tablet by mouth 2 (two) times daily.     Magnesium 400 MG TABS Take 1 tablet by mouth daily.     Omega-3 Fatty Acids (SALMON OIL-1000) 200 MG CAPS Take by mouth daily.     Red Yeast Rice Extract (RED YEAST RICE PO) Take by mouth.     saw palmetto 160 MG capsule Take 160 mg by mouth 2 (two) times daily.     tamsulosin (FLOMAX) 0.4 MG CAPS capsule Take 1 capsule (0.4 mg total) by mouth daily. 90 capsule 0   Vitamin D, Ergocalciferol, (DRISDOL) 1.25 MG (50000 UT) CAPS capsule Take 1 capsule by mouth once a week.     methotrexate (RHEUMATREX) 2.5 MG tablet Take 2.5 mg by mouth. Takes 4 Tabs Every Friday     No current facility-administered medications for this visit.    Allergies:   Amoxicillin    ROS:  Please see the history of present illness.   Otherwise, review of systems are positive for none .   All  other systems are reviewed and negative.    PHYSICAL EXAM: VS:  BP (!) 104/58   Pulse 64   Ht _0  (1.753 m)   Wt 214 lb 6.4 oz (97.3 kg)   SpO2 98%   BMI 31.66 kg/m  , BMI Body mass index is 31.66 kg/m. GENERAL:  Well appearing NECK:  No jugular venous distention, waveform within normal limits, carotid upstroke brisk and symmetric, no bruits, no thyromegaly LUNGS:  Clear to auscultation bilaterally CHEST:  Unremarkable HEART:  PMI not displaced or sustained,S1 and S2 within normal limits, no S3, no S4, no clicks, no rubs, no murmurs ABD:  Flat, positive bowel sounds normal in frequency in pitch, no bruits, no rebound, no guarding, no midline pulsatile mass, no hepatomegaly, no splenomegaly EXT:  2 plus pulses throughout, no edema, no cyanosis no clubbing  EKG:  EKG is not ordered today. NA   Recent Labs: No results found for requested labs within last 365 days.    Lipid Panel    Component Value Date/Time   CHOL 144 02/05/2021 0819   TRIG 130.0  02/05/2021 0819   HDL 34.90 (L) 02/05/2021 0819   CHOLHDL 4 02/05/2021 0819   VLDL 26.0 02/05/2021 0819   LDLCALC 83 02/05/2021 0819      Wt Readings from Last 3 Encounters:  06/10/22 214 lb 6.4 oz (97.3 kg)  12/03/21 207 lb 12.8 oz (94.3 kg)  05/17/21 213 lb 9.6 oz (96.9 kg)      Other studies Reviewed: Additional studies/ records that were reviewed today include: Labs Review of the above records demonstrates:  Please see elsewhere in the note.     ASSESSMENT AND PLAN:   ATRIAL FIB:   This is paroxysmal.  He has been  Mr. Jeremy Cardenas has a CHA2DS2 - VASc score of 2.   He is up-to-date with routine labs and had a CBC TSH to be met in September.  I reviewed these labs for this visit.  No change in therapy.   Current medicines are reviewed at length with the patient today.  The patient does not have concerns regarding medicines.  The following changes have been made:  None  Labs/ tests ordered today include: None  No orders of the defined types were placed in this encounter.    Disposition:   FU with  in one year.    Signed, Minus Breeding, MD  06/10/2022 9:01 AM    [place]

## 2022-06-10 ENCOUNTER — Encounter: Payer: Self-pay | Admitting: Cardiology

## 2022-06-10 ENCOUNTER — Ambulatory Visit: Payer: Medicare HMO | Attending: Cardiology | Admitting: Cardiology

## 2022-06-10 VITALS — BP 104/58 | HR 64 | Ht 69.0 in | Wt 214.4 lb

## 2022-06-10 DIAGNOSIS — I480 Paroxysmal atrial fibrillation: Secondary | ICD-10-CM | POA: Diagnosis not present

## 2022-06-10 NOTE — Patient Instructions (Signed)
Medication Instructions:   Your physician recommends that you continue on your current medications as directed. Please refer to the Current Medication list given to you today.  *If you need a refill on your cardiac medications before your next appointment, please call your pharmacy*  Lab Work: NONE ordered at this time of appointment   If you have labs (blood work) drawn today and your tests are completely normal, you will receive your results only by: MyChart Message (if you have MyChart) OR A paper copy in the mail If you have any lab test that is abnormal or we need to change your treatment, we will call you to review the results.  Testing/Procedures: NONE ordered at this time of appointment   Follow-Up: At [facility], you and your health needs are our priority.  As part of our continuing mission to provide you with exceptional heart care, we have created designated Provider Care Teams.  These Care Teams include your primary Cardiologist (physician) and Advanced Practice Providers (APPs -  Physician Assistants and Nurse Practitioners) who all work together to provide you with the care you need, when you need it.  Your next appointment:   1 year(s)  The format for your next appointment:   In Person  Provider:   James Hochrein, MD     Other Instructions  Important Information About Sugar       

## 2022-06-27 DIAGNOSIS — K5730 Diverticulosis of large intestine without perforation or abscess without bleeding: Secondary | ICD-10-CM | POA: Diagnosis not present

## 2022-06-27 DIAGNOSIS — K648 Other hemorrhoids: Secondary | ICD-10-CM | POA: Diagnosis not present

## 2022-06-27 DIAGNOSIS — Z1211 Encounter for screening for malignant neoplasm of colon: Secondary | ICD-10-CM | POA: Diagnosis not present

## 2022-06-27 DIAGNOSIS — D122 Benign neoplasm of ascending colon: Secondary | ICD-10-CM | POA: Diagnosis not present

## 2022-07-01 DIAGNOSIS — G4733 Obstructive sleep apnea (adult) (pediatric): Secondary | ICD-10-CM | POA: Diagnosis not present

## 2022-07-04 DIAGNOSIS — I4891 Unspecified atrial fibrillation: Secondary | ICD-10-CM | POA: Diagnosis not present

## 2022-07-04 DIAGNOSIS — G4733 Obstructive sleep apnea (adult) (pediatric): Secondary | ICD-10-CM | POA: Diagnosis not present

## 2022-07-04 DIAGNOSIS — Z008 Encounter for other general examination: Secondary | ICD-10-CM | POA: Diagnosis not present

## 2022-07-04 DIAGNOSIS — I951 Orthostatic hypotension: Secondary | ICD-10-CM | POA: Diagnosis not present

## 2022-07-04 DIAGNOSIS — Z8249 Family history of ischemic heart disease and other diseases of the circulatory system: Secondary | ICD-10-CM | POA: Diagnosis not present

## 2022-07-04 DIAGNOSIS — R030 Elevated blood-pressure reading, without diagnosis of hypertension: Secondary | ICD-10-CM | POA: Diagnosis not present

## 2022-07-04 DIAGNOSIS — N400 Enlarged prostate without lower urinary tract symptoms: Secondary | ICD-10-CM | POA: Diagnosis not present

## 2022-07-04 DIAGNOSIS — J309 Allergic rhinitis, unspecified: Secondary | ICD-10-CM | POA: Diagnosis not present

## 2022-07-04 DIAGNOSIS — M1990 Unspecified osteoarthritis, unspecified site: Secondary | ICD-10-CM | POA: Diagnosis not present

## 2022-07-04 DIAGNOSIS — N529 Male erectile dysfunction, unspecified: Secondary | ICD-10-CM | POA: Diagnosis not present

## 2022-07-04 DIAGNOSIS — D6869 Other thrombophilia: Secondary | ICD-10-CM | POA: Diagnosis not present

## 2022-07-04 DIAGNOSIS — Z87891 Personal history of nicotine dependence: Secondary | ICD-10-CM | POA: Diagnosis not present

## 2022-07-04 DIAGNOSIS — Z809 Family history of malignant neoplasm, unspecified: Secondary | ICD-10-CM | POA: Diagnosis not present

## 2022-07-08 DIAGNOSIS — M1711 Unilateral primary osteoarthritis, right knee: Secondary | ICD-10-CM | POA: Diagnosis not present

## 2022-07-08 DIAGNOSIS — M353 Polymyalgia rheumatica: Secondary | ICD-10-CM | POA: Diagnosis not present

## 2022-07-08 DIAGNOSIS — E559 Vitamin D deficiency, unspecified: Secondary | ICD-10-CM | POA: Diagnosis not present

## 2022-07-08 DIAGNOSIS — I4891 Unspecified atrial fibrillation: Secondary | ICD-10-CM | POA: Diagnosis not present

## 2022-07-08 DIAGNOSIS — Z79899 Other long term (current) drug therapy: Secondary | ICD-10-CM | POA: Diagnosis not present

## 2022-07-29 ENCOUNTER — Telehealth: Payer: Self-pay | Admitting: Cardiology

## 2022-07-29 DIAGNOSIS — I4819 Other persistent atrial fibrillation: Secondary | ICD-10-CM

## 2022-07-30 NOTE — Telephone Encounter (Signed)
Prescription refill request for Eliquis received. Indication:afib Last office visit:11/23 Scr:1.1 Age: 77 Weight:97.3  kg  Prescription refilled

## 2022-08-01 NOTE — Telephone Encounter (Signed)
*  STAT* If patient is at the pharmacy, call can be transferred to refill team.   1. Which medications need to be refilled? (please list name of each medication and dose if known) need new prescriptions for Diltiazem, Eliquis-changing pharmacy  2. Which pharmacy/location (including street and city if local pharmacy) is medication to be sent to? [facility]  3. Do they need a 30 day or 90 day supply? 90 days and refills

## 2022-08-02 MED ORDER — APIXABAN 5 MG PO TABS: 5.0000 mg | ORAL_TABLET | Freq: Two times a day (BID) | ORAL | 2 refills | Status: AC

## 2022-08-02 MED ORDER — DILTIAZEM HCL ER COATED BEADS 180 MG PO CP24: 180.0000 mg | ORAL_CAPSULE | Freq: Every day | ORAL | 2 refills | Status: DC

## 2022-08-02 NOTE — Telephone Encounter (Signed)
Eliquis '5mg'$  refill request received to a different pharmacy. Patient is 77 years old, weight-97.3kg, Crea-1.13 on 04/25/2022 via [facility] from [facility], [place], and last seen by Dr. Percival Spanish on 06/10/2022. Dose is appropriate based on dosing criteria. Will send in refill to requested pharmacy.

## 2022-08-02 NOTE — Addendum Note (Signed)
Addended by: Derrel Nip B on: 08/02/2022 09:04 AM   Modules accepted: Orders

## 2022-12-30 IMAGING — CR DG CHEST 2V
2 series · 2 of 2 positions shown · non-contrast
Comparison: 05/17/2020

CLINICAL DATA: Tachycardia

EXAM:
CHEST - 2 VIEW

[w chest pa]
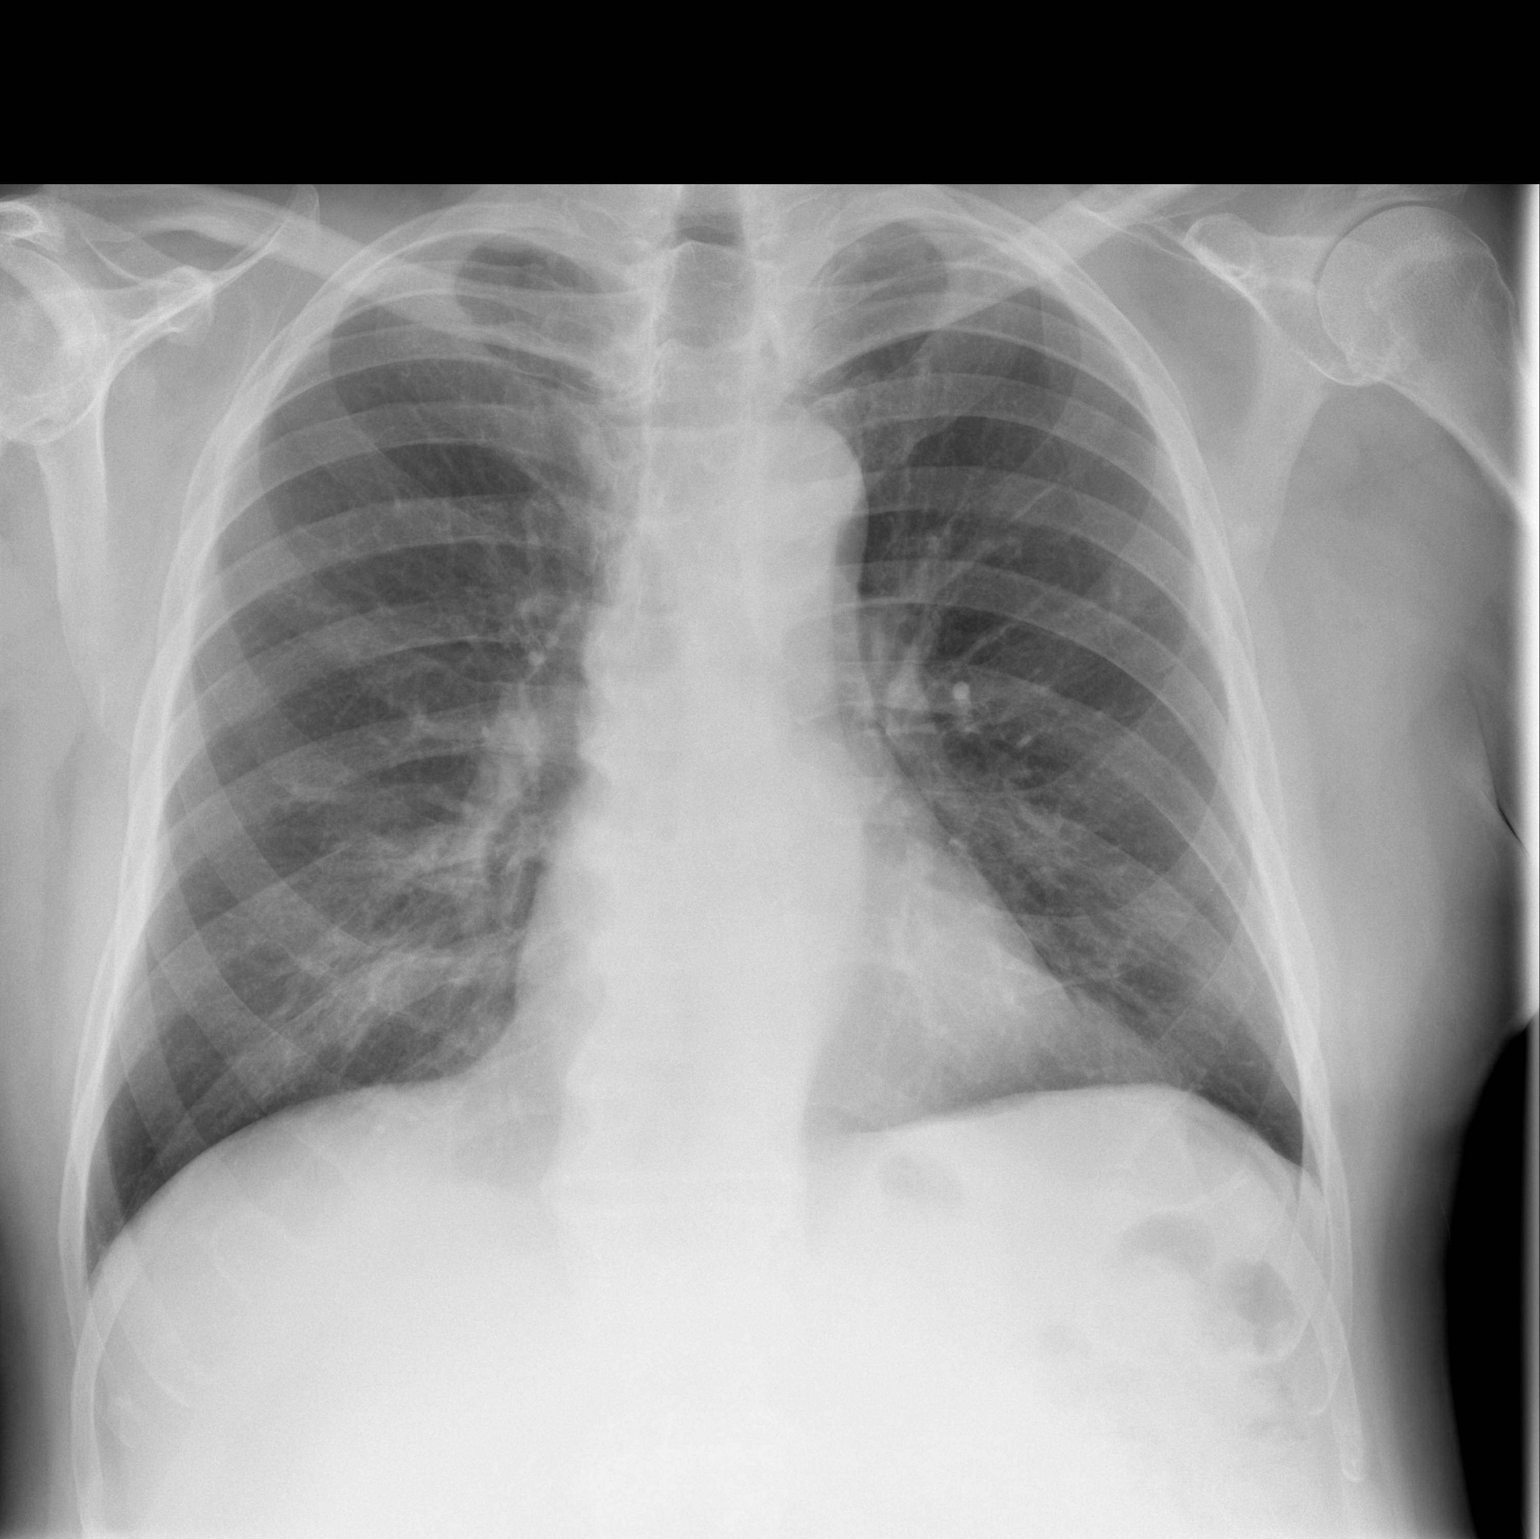

[w chest lat]
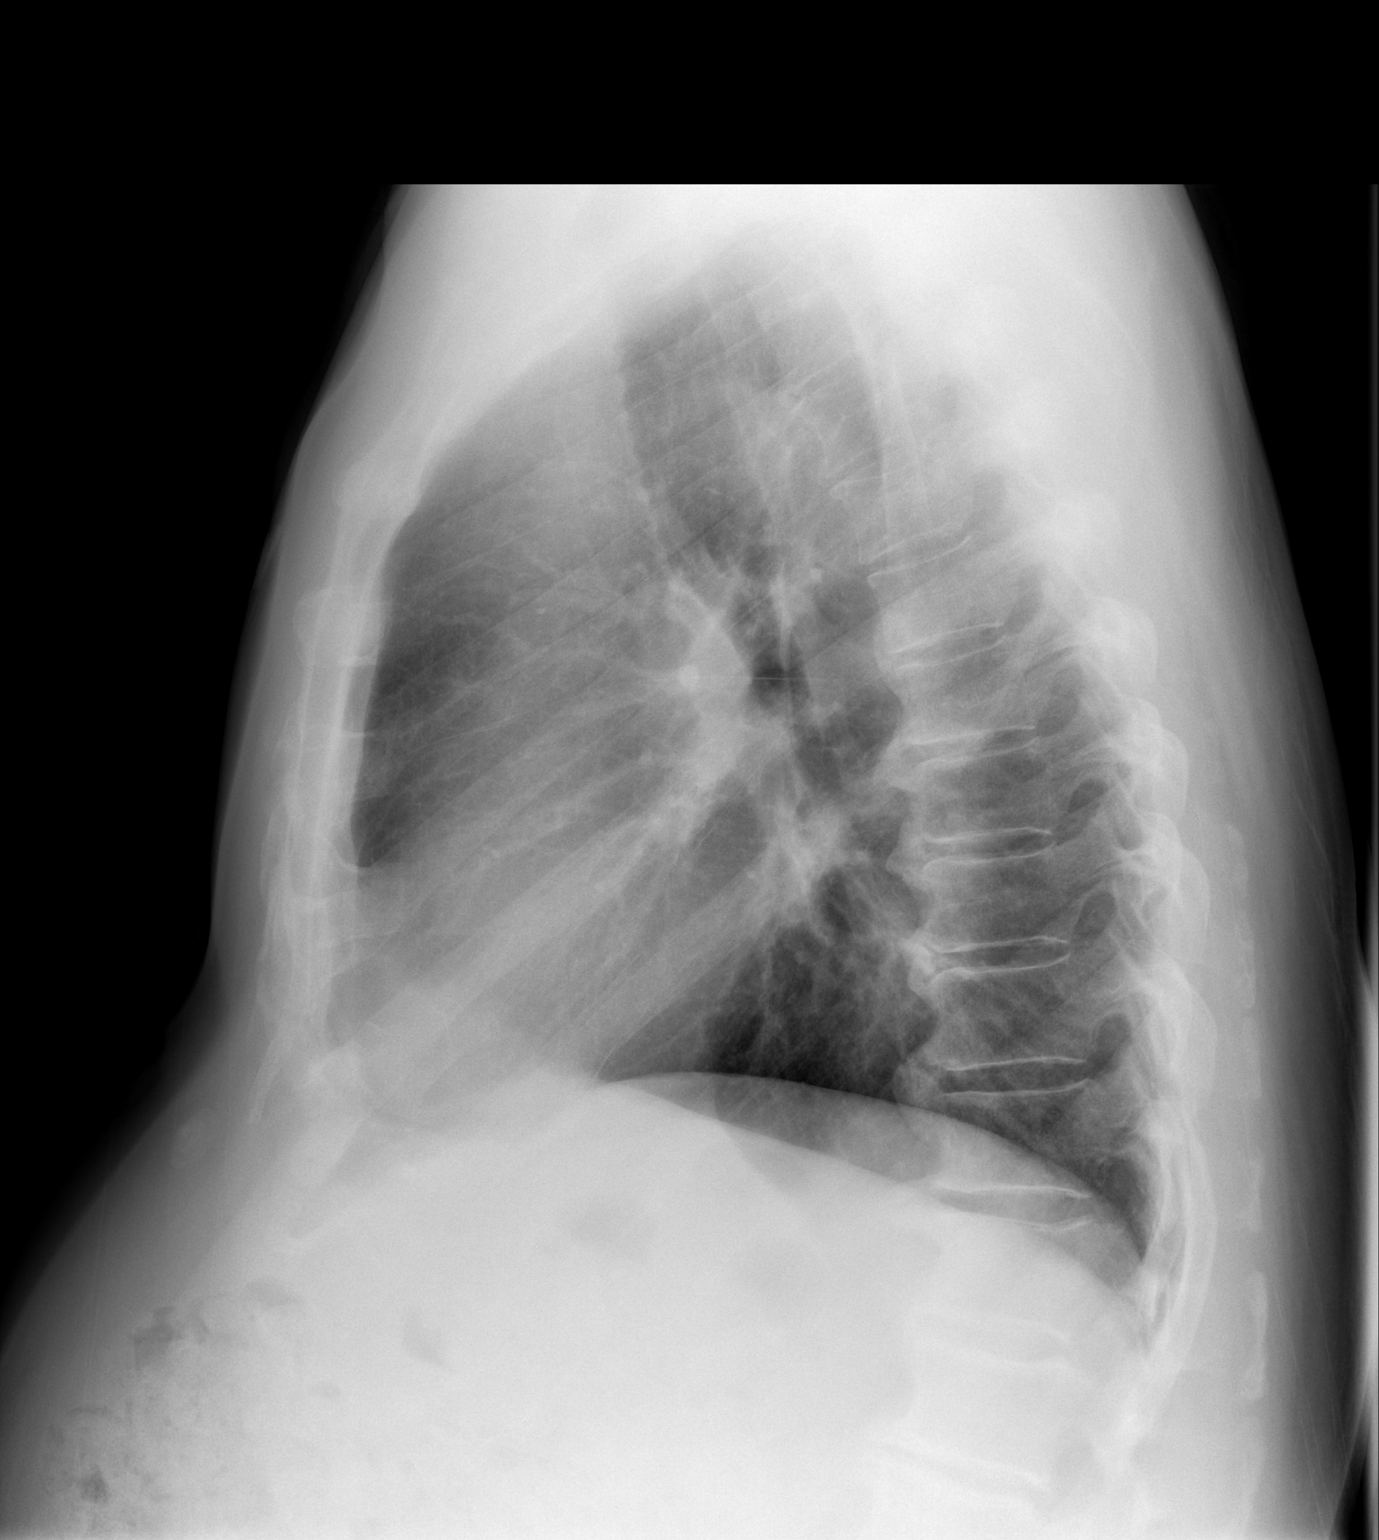

[2 of 2 positions shown; findings below may reference images not displayed]

FINDINGS: The heart size and mediastinal contours are within normal limits.
Both lungs are clear. No pleural effusion. The visualized skeletal
structures are unremarkable.
IMPRESSION: No active cardiopulmonary disease.

## 2023-06-08 NOTE — Progress Notes (Unsigned)
Cardiology Office Note:   Date:  06/11/2023  ID:  Jeremy Cardenas, DOB July 17, 1946, MRN [id number] PCP: Randel Pigg, Dorma Russell, MD  [facility] Providers Cardiologist:  Rollene Rotunda, MD {  History of Present Illness:   Jeremy Cardenas is a 77 y.o. male who I am seeing back for evaluation of palpitations.  He was in the emergency room in June 2022 for this.  There was no clear etiology identified.  Chest x-ray was unremarkable.   After the first appt I sent him for a POET (Plain Old Exercise Treadmill) to evaluate SOB.  However, when he presented for this he was in atrial fib.  He saw Dr. Jens Som.   He was started Eliquis and Cardizem and sent for an echo which was unremarkable.    Monitor demonstrated paroxysmal fib or flutter.  Rate was controlled.     Since I last saw him he has done well.  He is very active.  He is out in the woods and doing things.  Does lots of work in his yard.  He has a Watch but he does not wear it very often.  The patient denies any new symptoms such as chest discomfort, neck or arm discomfort. There has been no new shortness of breath, PND or orthopnea. There have been no reported palpitations, presyncope or syncope.     Studies Reviewed:    EKG:   EKG Interpretation Date/Time:  Wednesday June 11 2023 10:17:17 EST Ventricular Rate:  59 PR Interval:  160 QRS Duration:  88 QT Interval:  408 QTC Calculation: 403 R Axis:   42  Text Interpretation: Sinus bradycardia Anterior infarct (cited on or before 03-Jan-2021) When compared with ECG of 03-Jan-2021 11:30, No significant change since last tracing Confirmed by Rollene Rotunda ([id number]) on 06/11/2023 10:33:55 AM     Risk Assessment/Calculations:    CHA2DS2-VASc Score = 3   This indicates a 3.2% annual risk of stroke. The patient's score is based upon: CHF History: 0 HTN History: 0 Diabetes History: 1 Stroke History: 0 Vascular Disease History: 0 Age Score: 2 Gender Score: 0    VS:  BP (!)  102/56 (BP Location: Left Arm, Patient Position: Sitting, Cuff Size: Normal)   Pulse (!) 59   Ht 5\' 9"  (1.753 m)   Wt 205 lb (93 kg)   SpO2 98%   BMI 30.27 kg/m    Wt Readings from Last 3 Encounters:  06/11/23 205 lb (93 kg)  06/10/22 214 lb 6.4 oz (97.3 kg)  12/03/21 207 lb 12.8 oz (94.3 kg)     GEN: Well nourished, well developed in no acute distress NECK: No JVD; No carotid bruits CARDIAC: RRR, no murmurs, rubs, gallops RESPIRATORY:  Clear to auscultation without rales, wheezing or rhonchi  ABDOMEN: Soft, non-tender, non-distended EXTREMITIES:  No edema; No deformity   ASSESSMENT AND PLAN:   ATRIAL FIB:   This is paroxysmal.  He has had no symptomatic paroxysms.  I have asked him to go to schedule with [employer] to get his device set up to record his heart rate on his phone.  He is worried about using it because he does not want a lot of alerts and is worried that he will activate EMS if his heart rate goes up when he works hard.  I told him these are all settings that can be adjusted and he will make an appointment with them.  He tolerates anticoagulation.  No change in therapy.  He is  up-to-date with blood work.    Follow up with me in one year.   Signed, Rollene Rotunda, MD

## 2023-06-11 ENCOUNTER — Ambulatory Visit: Payer: Medicare HMO | Attending: Cardiology | Admitting: Cardiology

## 2023-06-11 ENCOUNTER — Encounter: Payer: Self-pay | Admitting: Cardiology

## 2023-06-11 VITALS — BP 102/56 | HR 59 | Ht 69.0 in | Wt 205.0 lb

## 2023-06-11 DIAGNOSIS — I480 Paroxysmal atrial fibrillation: Secondary | ICD-10-CM

## 2023-06-11 NOTE — Patient Instructions (Signed)
Medication Instructions:  No changes *If you need a refill on your cardiac medications before your next appointment, please call your pharmacy*   Follow-Up: At [facility], you and your health needs are our priority.  As part of our continuing mission to provide you with exceptional heart care, we have created designated Provider Care Teams.  These Care Teams include your primary Cardiologist (physician) and Advanced Practice Providers (APPs -  Physician Assistants and Nurse Practitioners) who all work together to provide you with the care you need, when you need it.  We recommend signing up for the patient portal called "MyChart".  Sign up information is provided on this After Visit Summary.  MyChart is used to connect with patients for Virtual Visits (Telemedicine).  Patients are able to view lab/test results, encounter notes, upcoming appointments, etc.  Non-urgent messages can be sent to your provider as well.   To learn more about what you can do with MyChart, go to [email or web address].    Your next appointment:   1 year(s)  Provider:   Minus Breeding, MD

## 2023-06-15 ENCOUNTER — Other Ambulatory Visit: Payer: Self-pay | Admitting: Cardiology

## 2023-06-15 DIAGNOSIS — I4819 Other persistent atrial fibrillation: Secondary | ICD-10-CM

## 2023-09-03 ENCOUNTER — Telehealth: Payer: Self-pay | Admitting: Cardiology

## 2023-09-03 DIAGNOSIS — I480 Paroxysmal atrial fibrillation: Secondary | ICD-10-CM

## 2023-09-03 NOTE — Telephone Encounter (Signed)
 Eilleen Grates, MD     If this persists and he feels like he needs to get seen can we get him into the Atrial Fib Clinic please.  Thanks   A. Fib clinic referral placed

## 2023-09-03 NOTE — Telephone Encounter (Signed)
 Patient c/o Palpitations:  STAT if patient reporting lightheadedness, shortness of breath, or chest pain  How long have you had palpitations/irregular HR/ Afib? Are you having the symptoms now? Work up this morning and he is in afib, yes  Are you currently experiencing lightheadedness, SOB or CP? no  Do you have a history of afib (atrial fibrillation) or irregular heart rhythm? yes  Have you checked your BP or HR? (document readings if available): no  Are you experiencing any other symptoms? no

## 2023-09-03 NOTE — Telephone Encounter (Signed)
 Patient identification verified by 2 forms. Bertina Cooks, RN    Called and spoke to patient  Relayed provider message below  Patient states:   -he has converted back into a normal rhythm  Informed patient:   -A fib referral placed   -if symptoms return or persist, outreach for A. Fib appointment  Patient verbalized understanding, no questions at this time

## 2023-09-03 NOTE — Telephone Encounter (Signed)
 Patient identification verified by 2 forms. Bertina Cooks, RN    Called and spoke to patient  Patient states:   -woke up this morning in A-Fib   -unsure what highest heart this morning   -smart watch says he is in A-fib   -feels a little bit of palpitations   -takes Eliquis  5mg  BID   -takes diltiazem  180mg  daily   -BP this morning:122/71 HR: 70  -does not recall the last time he was in A-fib  Patient denies:   -chest pressure/chest pain   -lightheaded/dizziness   -SOB   -weakness  Advised patient to continue monitor symptoms at home, message sent to Dr. Lavona Reviewed ED warning signs/precautions  Patient verbalized understanding, no questions at this time

## 2023-09-25 ENCOUNTER — Ambulatory Visit (HOSPITAL_COMMUNITY)
Admission: RE | Admit: 2023-09-25 | Discharge: 2023-09-25 | Disposition: A | Discharge status: Home / Self Care | Payer: Medicare HMO | Source: Ambulatory Visit | Attending: Internal Medicine | Admitting: Internal Medicine

## 2023-09-25 ENCOUNTER — Encounter (HOSPITAL_COMMUNITY): Payer: Self-pay | Admitting: Internal Medicine

## 2023-09-25 ENCOUNTER — Inpatient Hospital Stay (HOSPITAL_COMMUNITY)
Admission: RE | Admit: 2023-09-25 | Discharge: 2023-09-25 | Disposition: A | Discharge status: Home / Self Care | Payer: Medicare HMO | Source: Ambulatory Visit | Attending: Internal Medicine | Admitting: Internal Medicine

## 2023-09-25 VITALS — BP 110/60 | HR 59 | Ht 69.0 in | Wt 200.2 lb

## 2023-09-25 DIAGNOSIS — Z7901 Long term (current) use of anticoagulants: Secondary | ICD-10-CM | POA: Insufficient documentation

## 2023-09-25 DIAGNOSIS — D6869 Other thrombophilia: Secondary | ICD-10-CM | POA: Insufficient documentation

## 2023-09-25 DIAGNOSIS — E119 Type 2 diabetes mellitus without complications: Secondary | ICD-10-CM | POA: Insufficient documentation

## 2023-09-25 DIAGNOSIS — I480 Paroxysmal atrial fibrillation: Secondary | ICD-10-CM

## 2023-09-25 DIAGNOSIS — Z7984 Long term (current) use of oral hypoglycemic drugs: Secondary | ICD-10-CM | POA: Insufficient documentation

## 2023-09-25 DIAGNOSIS — G4733 Obstructive sleep apnea (adult) (pediatric): Secondary | ICD-10-CM | POA: Insufficient documentation

## 2023-09-25 NOTE — Progress Notes (Signed)
 Primary Care Physician: Randel Pigg, Dorma Russell, MD Primary Cardiologist: Rollene Rotunda, MD Electrophysiologist: None     Referring Physician: Dr. Lucita Ferrara Jeremy Cardenas is a 78 y.o. male with a history of T2DM, OSA, PMR, HLD, and paroxysmal atrial fibrillation who presents for consultation in the [facility]. Patient contacted office on 2/5 noting palpitations and watch stating he was in Afib for a period of time. Patient is on Eliquis 5 mg BID for a CHADS2VASC score of 3.  On evaluation today, he is currently in NSR. Patient tells me his watch has noted percentage of Afib burden in the 10-16% range for the past few weeks. Review of his watch shows that percentage to consist of possible Afib and "irregular heart rhythms." There are no ECG strips to review. He maybe felt jittery with episode on 2/5 but overall does not seem to have cardiac awareness. He is compliant with Eliquis 5 mg BID.  Today, he denies symptoms of palpitations, chest pain, shortness of breath, orthopnea, PND, lower extremity edema, dizziness, presyncope, syncope, snoring, daytime somnolence, bleeding, or neurologic sequela. The patient is tolerating medications without difficulties and is otherwise without complaint today.    Atrial Fibrillation Risk Factors:  he does have symptoms or diagnosis of sleep apnea. he is compliant with CPAP therapy.  he has a BMI of Body mass index is 29.56 kg/m.Marland Kitchen Filed Weights   09/25/23 1001  Weight: 90.8 kg    Current Outpatient Medications  Medication Sig Dispense Refill   apixaban (ELIQUIS) 5 MG TABS tablet Take 1 tablet (5 mg total) by mouth 2 (two) times daily. 180 tablet 2   Blood Glucose Monitoring Suppl (ONETOUCH VERIO FLEX SYSTEM) w/Device KIT as needed.     Cholecalciferol (VITAMIN D) 50 MCG (2000 UT) CAPS Take by mouth daily.     co-enzyme Q-10 30 MG capsule Take 30 mg by mouth daily.     diltiazem (CARDIZEM CD) 180 MG 24 hr capsule  TAKE 1 CAPSULE DAILY 90 capsule 3   fluticasone (FLONASE) 50 MCG/ACT nasal spray Place 1 spray into both nostrils as needed.     glucosamine-chondroitin 500-400 MG tablet Take 1 tablet by mouth 2 (two) times daily.     Lancets MISC as needed.     Magnesium 400 MG TABS Take 1 tablet by mouth daily.     metFORMIN (GLUCOPHAGE-XR) 500 MG 24 hr tablet Take 1 tablet by mouth daily with breakfast.     Omega-3 Fatty Acids (SALMON OIL-1000) 200 MG CAPS Take by mouth daily.     ONETOUCH VERIO test strip 1 each by Other route as needed.     Red Yeast Rice Extract (RED YEAST RICE PO) Take by mouth.     saw palmetto 160 MG capsule Take 160 mg by mouth 2 (two) times daily.     Syringe/Needle, Disp, (SYRINGE 3CC/25GX1") 25G X 1" 3 ML MISC Use with b12 injection.     tamsulosin (FLOMAX) 0.4 MG CAPS capsule Take 1 capsule (0.4 mg total) by mouth daily. 90 capsule 0   No current facility-administered medications for this encounter.    Atrial Fibrillation Management history:  Previous antiarrhythmic drugs: none Previous cardioversions: none Previous ablations: none Anticoagulation history: Eliquis   ROS- All systems are reviewed and negative except as per the HPI above.  Physical Exam: BP 110/60   Pulse (!) 59   Ht 5\' 9"  (1.753 m)   Wt 90.8 kg   BMI  29.56 kg/m   GEN: Well nourished, well developed in no acute distress NECK: No JVD; No carotid bruits CARDIAC: Regular rate and rhythm, no murmurs, rubs, gallops RESPIRATORY:  Clear to auscultation without rales, wheezing or rhonchi  ABDOMEN: Soft, non-tender, non-distended EXTREMITIES:  No edema; No deformity   EKG today demonstrates  Vent. rate 59 BPM PR interval 152 ms QRS duration 86 ms QT/QTcB 414/409 ms P-R-T axes 45 56 42 Sinus bradycardia Otherwise normal ECG When compared with ECG of 11-Jun-2023 10:17, PREVIOUS ECG IS PRESENT  Echo 02/13/21 demonstrated  1. Left ventricular ejection fraction, by estimation, is 55 to 60%. The   left ventricle has normal function. The left ventricle has no regional  wall motion abnormalities. Left ventricular diastolic parameters were  normal.   2. Right ventricular systolic function is normal. The right ventricular  size is normal. There is normal pulmonary artery systolic pressure.   3. The mitral valve is normal in structure. No evidence of mitral valve  regurgitation. No evidence of mitral stenosis.   4. The aortic valve is tricuspid. Aortic valve regurgitation is not  visualized. No aortic stenosis is present.   5. The inferior vena cava is normal in size with greater than 50%  respiratory variability, suggesting right atrial pressure of 3 mmHg.    ASSESSMENT & PLAN CHA2DS2-VASc Score = 3  The patient's score is based upon: CHF History: 0 HTN History: 0 Diabetes History: 1 Stroke History: 0 Vascular Disease History: 0 Age Score: 2 Gender Score: 0       ASSESSMENT AND PLAN: Paroxysmal Atrial Fibrillation (ICD10:  I48.0) The patient's CHA2DS2-VASc score is 3, indicating a 3.2% annual risk of stroke.    He is currently in NSR. We discussed potential treatment methods briefly including rhythm control. However, I do not truly know patient's burden and HR at this time during Afib episodes. Will place 2 week monitor to determine burden of arrhythmia and HR. Will discuss monitor results with primary cardiologist to help finalize plan of care.   Secondary Hypercoagulable State (ICD10:  D68.69) The patient is at significant risk for stroke/thromboembolism based upon his CHA2DS2-VASc Score of 3.  Continue Apixaban (Eliquis).  No missed doses.    Will contact patient with monitor results.    Lake Bells, PA-C  Afib Clinic [facility] [address] [place], [place] [address] [phone]

## 2023-10-20 NOTE — Addendum Note (Signed)
 Encounter addended by: Shona Simpson, RN on: 10/20/2023 10:41 AM  Actions taken: Imaging Exam ended

## 2023-11-04 ENCOUNTER — Ambulatory Visit (HOSPITAL_COMMUNITY)
Admission: RE | Admit: 2023-11-04 | Discharge: 2023-11-04 | Disposition: A | Discharge status: Home / Self Care | Source: Ambulatory Visit | Attending: Internal Medicine | Admitting: Internal Medicine

## 2023-11-04 VITALS — BP 104/72 | HR 85 | Ht 69.0 in | Wt 197.0 lb

## 2023-11-04 DIAGNOSIS — I480 Paroxysmal atrial fibrillation: Secondary | ICD-10-CM | POA: Diagnosis not present

## 2023-11-04 DIAGNOSIS — M353 Polymyalgia rheumatica: Secondary | ICD-10-CM | POA: Diagnosis not present

## 2023-11-04 DIAGNOSIS — G4733 Obstructive sleep apnea (adult) (pediatric): Secondary | ICD-10-CM | POA: Diagnosis not present

## 2023-11-04 DIAGNOSIS — E119 Type 2 diabetes mellitus without complications: Secondary | ICD-10-CM | POA: Diagnosis not present

## 2023-11-04 DIAGNOSIS — Z7984 Long term (current) use of oral hypoglycemic drugs: Secondary | ICD-10-CM | POA: Diagnosis not present

## 2023-11-04 DIAGNOSIS — Z7901 Long term (current) use of anticoagulants: Secondary | ICD-10-CM | POA: Diagnosis not present

## 2023-11-04 DIAGNOSIS — Z79899 Other long term (current) drug therapy: Secondary | ICD-10-CM | POA: Diagnosis not present

## 2023-11-04 DIAGNOSIS — D6869 Other thrombophilia: Secondary | ICD-10-CM | POA: Insufficient documentation

## 2023-11-04 MED ORDER — FLECAINIDE ACETATE 50 MG PO TABS: 50.0000 mg | ORAL_TABLET | Freq: Two times a day (BID) | ORAL | 3 refills | Status: DC

## 2023-11-04 NOTE — Patient Instructions (Signed)
 Start Flecainide 50mg  twice a day

## 2023-11-04 NOTE — Progress Notes (Signed)
 Primary Care Physician: Randel Pigg, Dorma Russell, MD Primary Cardiologist: Rollene Rotunda, MD Electrophysiologist: None     Referring Physician: Dr. Lucita Ferrara Jeremy Cardenas is a 78 y.o. male with a history of T2DM, OSA, PMR, HLD, and paroxysmal atrial fibrillation who presents for consultation in the [facility]. Patient contacted office on 2/5 noting palpitations and watch stating he was in Afib for a period of time. Patient is on Eliquis 5 mg BID for a CHADS2VASC score of 3.  On follow up 11/04/23, he is here in Afib currently. He notes to possibly feel tired and have lack of energy at times. Cardiac monitor recently worn showed 33% Afib burden with longest episode almost 17 hours (average HR 93 bpm). No missed doses of Eliquis.   Today, he denies symptoms of palpitations, chest pain, shortness of breath, orthopnea, PND, lower extremity edema, dizziness, presyncope, syncope, snoring, daytime somnolence, bleeding, or neurologic sequela. The patient is tolerating medications without difficulties and is otherwise without complaint today.    Atrial Fibrillation Risk Factors:  he does have symptoms or diagnosis of sleep apnea. he is compliant with CPAP therapy.  he has a BMI of Body mass index is 29.09 kg/m.Marland Kitchen Filed Weights   11/04/23 0941  Weight: 89.4 kg     Current Outpatient Medications  Medication Sig Dispense Refill   apixaban (ELIQUIS) 5 MG TABS tablet Take 1 tablet (5 mg total) by mouth 2 (two) times daily. 180 tablet 2   Blood Glucose Monitoring Suppl (ONETOUCH VERIO FLEX SYSTEM) w/Device KIT as needed.     Cholecalciferol (VITAMIN D) 50 MCG (2000 UT) CAPS Take 1 capsule by mouth daily.     co-enzyme Q-10 30 MG capsule Take 30 mg by mouth daily.     diltiazem (CARDIZEM CD) 180 MG 24 hr capsule TAKE 1 CAPSULE DAILY 90 capsule 3   flecainide (TAMBOCOR) 50 MG tablet Take 1 tablet (50 mg total) by mouth 2 (two) times daily. 60 tablet 3   fluticasone  (FLONASE) 50 MCG/ACT nasal spray Place 1 spray into both nostrils as needed.     glucosamine-chondroitin 500-400 MG tablet Take 1 tablet by mouth 2 (two) times daily.     Lancets MISC as needed.     Magnesium 400 MG TABS Take 1 tablet by mouth daily.     metFORMIN (GLUCOPHAGE-XR) 500 MG 24 hr tablet Take 1 tablet by mouth daily with breakfast.     mupirocin ointment (BACTROBAN) 2 % Apply topically 2 (two) times daily as needed.     Omega-3 Fatty Acids (SALMON OIL-1000) 200 MG CAPS Take 2 capsules by mouth daily.     ONETOUCH VERIO test strip 1 each by Other route as needed.     Red Yeast Rice Extract (RED YEAST RICE PO) Take 3 tablets by mouth every morning.     saw palmetto 160 MG capsule Take 160 mg by mouth 2 (two) times daily.     Syringe/Needle, Disp, (SYRINGE 3CC/25GX1") 25G X 1" 3 ML MISC Use with b12 injection.     tamsulosin (FLOMAX) 0.4 MG CAPS capsule Take 1 capsule (0.4 mg total) by mouth daily. 90 capsule 0   No current facility-administered medications for this encounter.    Atrial Fibrillation Management history:  Previous antiarrhythmic drugs: none Previous cardioversions: none Previous ablations: none Anticoagulation history: Eliquis   ROS- All systems are reviewed and negative except as per the HPI above.  Physical Exam: BP 104/72   Pulse  85   Ht 5\' 9"  (1.753 m)   Wt 89.4 kg   BMI 29.09 kg/m   GEN- The patient is well appearing, alert and oriented x 3 today.   Neck - no JVD or carotid bruit noted Lungs- Clear to ausculation bilaterally, normal work of breathing Heart- Irregular rate and rhythm, no murmurs, rubs or gallops, PMI not laterally displaced Extremities- no clubbing, cyanosis, or edema Skin - no rash or ecchymosis noted   EKG today demonstrates  Vent. rate 85 BPM PR interval * ms QRS duration 86 ms QT/QTcB 350/416 ms P-R-T axes * 68 6 Atrial fibrillation Possible Anterior infarct , age undetermined Abnormal ECG When compared with ECG of  25-Sep-2023 10:03, PREVIOUS ECG IS PRESENT  Echo 02/13/21 demonstrated  1. Left ventricular ejection fraction, by estimation, is 55 to 60%. The  left ventricle has normal function. The left ventricle has no regional  wall motion abnormalities. Left ventricular diastolic parameters were  normal.   2. Right ventricular systolic function is normal. The right ventricular  size is normal. There is normal pulmonary artery systolic pressure.   3. The mitral valve is normal in structure. No evidence of mitral valve  regurgitation. No evidence of mitral stenosis.   4. The aortic valve is tricuspid. Aortic valve regurgitation is not  visualized. No aortic stenosis is present.   5. The inferior vena cava is normal in size with greater than 50%  respiratory variability, suggesting right atrial pressure of 3 mmHg.   Cardiac monitor 08/2023: HR 43-190, average 73 bpm. Atrial fibrillation detected. Rare supraventricular ectopy. Rare ventricular ectopy.  33% atrial fibrillation burden, average heart rate 93 bpm, longest episode 16 hours 48 minutes No symptom trigger episodes.     [name] Cardiac Electrophysiology   ASSESSMENT & PLAN CHA2DS2-VASc Score = 3  The patient's score is based upon: CHF History: 0 HTN History: 0 Diabetes History: 1 Stroke History: 0 Vascular Disease History: 0 Age Score: 2 Gender Score: 0       ASSESSMENT AND PLAN: Paroxysmal Atrial Fibrillation (ICD10:  I48.0) The patient's CHA2DS2-VASc score is 3, indicating a 3.2% annual risk of stroke.    He is currently in Afib. We had a long discussion about medication treatments and ablation in detail. We discussed potential options such as Multaq, flecainide, Tikosyn, and amiodarone. We talked about the monitoring required for these medications, hospital admission for Tikosyn, and potential adverse effects. We also discussed pulse field ablation as an option in the form of intervention. After discussion,  patient is interested in beginning flecainide 50 mg BID (wife takes this). Continue diltiazem 180 mg daily. Repeat ECG in 7-10 days. Cardiac CT 10/17/23 showed CAC score of 46.3. Will do TST after repeat ECG.  PR 152 ms Qtc 409 ms   Secondary Hypercoagulable State (ICD10:  D68.69) The patient is at significant risk for stroke/thromboembolism based upon his CHA2DS2-VASc Score of 3.  Continue Apixaban (Eliquis).  No missed doses.     Follow up ECG in 7-10 days.     Lake Bells, PA-C  Afib Clinic [facility] [address] [place], [place] [address] [phone]

## 2023-11-12 ENCOUNTER — Ambulatory Visit (HOSPITAL_COMMUNITY)
Admission: RE | Admit: 2023-11-12 | Discharge: 2023-11-12 | Disposition: A | Discharge status: Home / Self Care | Source: Ambulatory Visit | Attending: Physician Assistant | Admitting: Physician Assistant

## 2023-11-12 DIAGNOSIS — D6869 Other thrombophilia: Secondary | ICD-10-CM | POA: Diagnosis not present

## 2023-11-12 DIAGNOSIS — I480 Paroxysmal atrial fibrillation: Secondary | ICD-10-CM | POA: Insufficient documentation

## 2023-11-12 DIAGNOSIS — Z5181 Encounter for therapeutic drug level monitoring: Secondary | ICD-10-CM

## 2023-11-12 NOTE — Progress Notes (Addendum)
 Patient returns for ECG after flecainide start. ECG shows:  SB Vent. rate 55 BPM PR interval 178 ms QRS duration 94 ms QT/QTcB 436/417 ms   He is tolerating the medication well. Will order TST. Follow up with Jeremy Cardenas in 3 months.    Informed Consent   Shared Decision Making/Informed Consent The risks [chest pain, shortness of breath, cardiac arrhythmias, dizziness, blood pressure fluctuations, myocardial infarction, stroke/transient ischemic attack, and life-threatening complications (estimated to be 1 in 10,000)], benefits (risk stratification, diagnosing coronary artery disease, treatment guidance) and alternatives of an exercise tolerance test were discussed in detail with Jeremy Cardenas and he agrees to proceed.

## 2023-11-13 ENCOUNTER — Telehealth: Payer: Self-pay | Admitting: Cardiology

## 2023-11-13 NOTE — Telephone Encounter (Signed)
  Patient would like to switch from Dr Lavonne Prairie to Dr Micael Adas

## 2023-11-14 ENCOUNTER — Other Ambulatory Visit (HOSPITAL_COMMUNITY): Payer: Self-pay

## 2023-11-14 DIAGNOSIS — I4891 Unspecified atrial fibrillation: Secondary | ICD-10-CM

## 2023-11-18 ENCOUNTER — Telehealth: Payer: Self-pay

## 2023-11-19 ENCOUNTER — Telehealth (HOSPITAL_COMMUNITY): Payer: Self-pay | Admitting: Physician Assistant

## 2023-11-19 NOTE — Telephone Encounter (Signed)
 Patient cancelled GXT for reason below:  11/18/2023 4:56 PM [id number], Jeremy Cardenas  Cancel Rsn: Patient (Patient has bad knees and can't walk the treadmill. Will call back. S.Williams CCT)   Order will be removed from the GXT WQ.

## 2023-11-25 ENCOUNTER — Ambulatory Visit (HOSPITAL_COMMUNITY)

## 2024-01-21 ENCOUNTER — Other Ambulatory Visit (HOSPITAL_COMMUNITY): Payer: Self-pay | Admitting: Internal Medicine

## 2024-02-11 ENCOUNTER — Ambulatory Visit (HOSPITAL_COMMUNITY)
Admission: RE | Admit: 2024-02-11 | Discharge: 2024-02-11 | Disposition: A | Discharge status: Home / Self Care | Source: Ambulatory Visit | Attending: Internal Medicine | Admitting: Internal Medicine

## 2024-02-11 VITALS — BP 116/58 | HR 52 | Ht 69.0 in | Wt 195.6 lb

## 2024-02-11 DIAGNOSIS — Z5181 Encounter for therapeutic drug level monitoring: Secondary | ICD-10-CM

## 2024-02-11 DIAGNOSIS — D6869 Other thrombophilia: Secondary | ICD-10-CM

## 2024-02-11 DIAGNOSIS — I480 Paroxysmal atrial fibrillation: Secondary | ICD-10-CM

## 2024-02-11 DIAGNOSIS — I4891 Unspecified atrial fibrillation: Secondary | ICD-10-CM | POA: Diagnosis not present

## 2024-02-11 DIAGNOSIS — Z79899 Other long term (current) drug therapy: Secondary | ICD-10-CM

## 2024-02-11 NOTE — Progress Notes (Signed)
 Primary Care Physician: Jeremy Cardenas, Aliene, MD Primary Cardiologist: Jeremy Schilling, MD Electrophysiologist: None     Referring Physician: Dr. Schilling Jeremy Cardenas is a 78 y.o. male with a history of T2DM, OSA, PMR, HLD, and paroxysmal atrial fibrillation who presents for consultation in the [facility]. Patient contacted office on 2/5 noting palpitations and watch stating he was in Afib for a period of time. Patient is on Eliquis  5 mg BID for a CHADS2VASC score of 3.  On follow up 11/04/23, he is here in Afib currently. He notes to possibly feel tired and have lack of energy at times. Cardiac monitor recently worn showed 33% Afib burden with longest episode almost 17 hours (average HR 93 bpm). No missed doses of Eliquis .   On follow up 02/11/24, patient is here for flecainide  surveillance. He is currently in NSR. No adverse effects from flecainide  or bleeding issues from Eliquis . He notes since beginning flecainide  his Apple watch states burden is 2% or less.   Today, he denies symptoms of palpitations, chest pain, shortness of breath, orthopnea, PND, lower extremity edema, dizziness, presyncope, syncope, snoring, daytime somnolence, bleeding, or neurologic sequela. The patient is tolerating medications without difficulties and is otherwise without complaint today.    Atrial Fibrillation Risk Factors:  he does have symptoms or diagnosis of sleep apnea. he is compliant with CPAP therapy.  he has a BMI of Body mass index is 28.89 kg/m.SABRA Filed Weights   02/11/24 1029  Weight: 88.7 kg      Current Outpatient Medications  Medication Sig Dispense Refill   apixaban  (ELIQUIS ) 5 MG TABS tablet Take 1 tablet (5 mg total) by mouth 2 (two) times daily. 180 tablet 2   Blood Glucose Monitoring Suppl (ONETOUCH VERIO FLEX SYSTEM) w/Device KIT as needed.     Cholecalciferol (VITAMIN D) 50 MCG (2000 UT) CAPS Take 1 capsule by mouth daily.     co-enzyme Q-10  30 MG capsule Take 30 mg by mouth daily.     diltiazem  (CARDIZEM  CD) 180 MG 24 hr capsule TAKE 1 CAPSULE DAILY 90 capsule 3   flecainide  (TAMBOCOR ) 50 MG tablet TAKE 1 TABLET BY MOUTH TWICE A DAY 180 tablet 1   fluticasone (FLONASE) 50 MCG/ACT nasal spray Place 1 spray into both nostrils as needed.     glucosamine-chondroitin 500-400 MG tablet Take 1 tablet by mouth 2 (two) times daily.     Lancets MISC as needed.     Magnesium 400 MG TABS Take 1 tablet by mouth daily.     metFORMIN (GLUCOPHAGE-XR) 500 MG 24 hr tablet Take 1 tablet by mouth daily with breakfast.     mupirocin ointment (BACTROBAN) 2 % Apply topically 2 (two) times daily as needed.     Omega-3 Fatty Acids (SALMON OIL-1000) 200 MG CAPS Take 2 capsules by mouth daily.     ONETOUCH VERIO test strip 1 each by Other route as needed.     Red Yeast Rice Extract (RED YEAST RICE PO) Take 3 tablets by mouth every morning.     saw palmetto 160 MG capsule Take 160 mg by mouth 2 (two) times daily.     Syringe/Needle, Disp, (SYRINGE 3CC/25GX1) 25G X 1 3 ML MISC Use with b12 injection.     tamsulosin  (FLOMAX ) 0.4 MG CAPS capsule Take 1 capsule (0.4 mg total) by mouth daily. 90 capsule 0   No current facility-administered medications for this encounter.    Atrial Fibrillation Management  history:  Previous antiarrhythmic drugs: flecainide  Previous cardioversions: none Previous ablations: none Anticoagulation history: Eliquis    ROS- All systems are reviewed and negative except as per the HPI above.  Physical Exam: BP (!) 116/58   Pulse (!) 52   Ht 5' 9 (1.753 m)   Wt 88.7 kg   BMI 28.89 kg/m   GEN- The patient is well appearing, alert and oriented x 3 today.   Neck - no JVD or carotid bruit noted Lungs- Clear to ausculation bilaterally, normal work of breathing Heart- Regular bradycardic rate and rhythm, no murmurs, rubs or gallops, PMI not laterally displaced Extremities- no clubbing, cyanosis, or edema Skin - no rash or  ecchymosis noted    EKG today demonstrates  Vent. rate 52 BPM PR interval 184 ms QRS duration 100 ms QT/QTcB 448/416 ms P-R-T axes 51 83 34 Sinus bradycardia Possible Anterior infarct (cited on or before 04-Nov-2023) Abnormal ECG When compared with ECG of 12-Nov-2023 09:55, No significant change was found  Echo 02/13/21 demonstrated  1. Left ventricular ejection fraction, by estimation, is 55 to 60%. The  left ventricle has normal function. The left ventricle has no regional  wall motion abnormalities. Left ventricular diastolic parameters were  normal.   2. Right ventricular systolic function is normal. The right ventricular  size is normal. There is normal pulmonary artery systolic pressure.   3. The mitral valve is normal in structure. No evidence of mitral valve  regurgitation. No evidence of mitral stenosis.   4. The aortic valve is tricuspid. Aortic valve regurgitation is not  visualized. No aortic stenosis is present.   5. The inferior vena cava is normal in size with greater than 50%  respiratory variability, suggesting right atrial pressure of 3 mmHg.   Cardiac monitor 08/2023: HR 43-190, average 73 bpm. Atrial fibrillation detected. Rare supraventricular ectopy. Rare ventricular ectopy.  33% atrial fibrillation burden, average heart rate 93 bpm, longest episode 16 hours 48 minutes No symptom trigger episodes.     Will Jeremy Cardenas Cardiac Electrophysiology   ASSESSMENT & PLAN CHA2DS2-VASc Score = 3  The patient's score is based upon: CHF History: 0 HTN History: 0 Diabetes History: 1 Stroke History: 0 Vascular Disease History: 0 Age Score: 2 Gender Score: 0       ASSESSMENT AND PLAN: Paroxysmal Atrial Fibrillation (ICD10:  I48.0) The patient's CHA2DS2-VASc score is 3, indicating a 3.2% annual risk of stroke.    He is currently in NSR. He is doing well with low burden overall.   High risk medication monitoring (ICD10: J342684) Patient requires  ongoing monitoring for anti-arrhythmic medication which has the potential to cause life threatening arrhythmias or AV block. ECG intervals are stable. Continue flecainide  50 mg BID.   Secondary Hypercoagulable State (ICD10:  D68.69) The patient is at significant risk for stroke/thromboembolism based upon his CHA2DS2-VASc Score of 3.  Continue Apixaban  (Eliquis ).  No missed doses.     Follow up 6 months for flecainide  surveillance.   Terra Pac, PA-C  [facility] [address] [place], [place] [address] [phone]

## 2024-02-17 NOTE — Therapy (Signed)
 OUTPATIENT PHYSICAL THERAPY SHOULDER EVALUATION   Patient Name: Jeremy Cardenas MRN: [id number] DOB:24-Nov-1945, 78 y.o., male Today's Date: 02/18/2024  END OF SESSION:  PT End of Session - 02/18/24 1535     Visit Number 1    Date for PT Re-Evaluation 03/31/24    Authorization Type [employer]    PT Start Time 1535    PT Stop Time 1615    PT Time Calculation (min) 40 min          Past Medical History:  Diagnosis Date   Colon polyps    COVID-19    Hearing loss    Hyperlipidemia    Hypothyroidism    Internal hemorrhoids    Leiomyoma    PAF (paroxysmal atrial fibrillation) (HCC)    PMR (polymyalgia rheumatica) (HCC)    Sigmoid diverticulosis    Sleep apnea    CPAP   Past Surgical History:  Procedure Laterality Date   COLONOSCOPY W/ BIOPSIES     rotator cuff surgery     Patient Active Problem List   Diagnosis Date Noted   Hypercoagulable state due to paroxysmal atrial fibrillation (HCC) 09/25/2023   Paroxysmal atrial fibrillation (HCC) 03/14/2021   SOB (shortness of breath) 03/14/2021   PMR (polymyalgia rheumatica) (HCC) 02/02/2021   Palpitations 02/02/2021   Primary osteoarthritis of right knee 09/15/2018   Xerosis cutis 09/15/2018   Benign prostatic hyperplasia with urinary hesitancy 06/11/2017   Hearing loss associated with syndrome, bilateral 06/11/2017   Healthcare maintenance 05/25/2013   ERECTILE DYSFUNCTION, ORGANIC 05/28/2010   Closed dislocation of acromioclavicular joint 10/20/2007   Allergic rhinitis 06/11/2007   Obstructive sleep apnea 05/28/2007    PCP: Aliene Nikki Rams  REFERRING PROVIDER: Ozell Gentile  REFERRING DIAG: Rt Rotator Cuff  THERAPY DIAG:  Muscle weakness (generalized)  Chronic right shoulder pain  Stiffness of right shoulder, not elsewhere classified  Rationale for Evaluation and Treatment: Rehabilitation  ONSET DATE: 02/02/24  SUBJECTIVE:                                                                                                                                                                                       SUBJECTIVE STATEMENT: I had rotator cuff durgery 16 years ago. It was a doing good up until about 8-9 months ago. About 2 months ago I went to a Careers adviser in [place] and they told me the only thing I could do is get a replacement. But before I can do it, I have to go through rehab.    PERTINENT HISTORY: Closed ACJ dislocation 2024 Polymyalgia rheumatica  A fib  [name] is a 78 y.o. male who presents today with bilateral  shoulder pain. He has been seen for the right side previously and diagnosed with rotator cuff arthropathy. We discussed reverse replacement on the right shoulder previously. The right still bothers him but he is learned to live with it. The left is now bothering him some, likely from overuse. He has had no injury to the left shoulder and no previous treatment or surgery.  Left shoulder tendinitis, no sign of rotator cuff tear or arthritis  Right shoulder rotator cuff arthropathy with significant thinning of acromion   PAIN:  Are you having pain? Yes: NPRS scale: right now the pain is 0 on the R, L shoulder is bothering me some  Pain location: bilateral shoulders Pain description: dull, achy  Aggravating factors: overhead working for some time Relieving factors: does not normally take anything but will take OTC if needed  PRECAUTIONS: None  RED FLAGS: None   WEIGHT BEARING RESTRICTIONS: No  FALLS:  Has patient fallen in last 6 months? No   PLOF: Independent  PATIENT GOALS: I just need to get back in for the surgery  NEXT MD VISIT:   OBJECTIVE:  Note: Objective measures were completed at Evaluation unless otherwise noted.  DIAGNOSTIC FINDINGS:  AP and lateral x-ray of left shoulder demonstrate healthy glenohumeral joint space.  Relatively open subacromial space.  Some spurring and narrowing of the [facility] joint.    COGNITION: Overall cognitive status:  Within functional limits for tasks assessed     SENSATION: WFL   UPPER EXTREMITY ROM: can get within functional ranges but has pain, some tightness with IR on both sides   UPPER EXTREMITY MMT:  MMT Right eval Left eval  Shoulder flexion 2+ with pain 3+  Shoulder extension    Shoulder abduction 2+ with pain 3- with pain  Shoulder adduction    Shoulder internal rotation 4 5  Shoulder external rotation 3- 5  Middle trapezius    Lower trapezius    Elbow flexion    Elbow extension    Wrist flexion    Wrist extension    Wrist ulnar deviation    Wrist radial deviation    Wrist pronation    Wrist supination    Grip strength (lbs)    (Blank rows = not tested)  SHOULDER SPECIAL TESTS: Impingement tests: Neer impingement test: positive , Hawkins/Kennedy impingement test: positive , and Painful arc test: positive    JOINT MOBILITY TESTING:  Full PROM but with pain and crepitus in R shoulder                                                                                                                            TREATMENT DATE:  02/18/24 EVAL   PATIENT EDUCATION: Education details: POC, HEP, how strengthening can help prior to surgery Person educated: Patient Education method: Explanation Education comprehension: verbalized understanding  HOME EXERCISE PROGRAM: Access Code: [email or web address] URL: [email or web address] Date: 02/18/2024 Prepared by: Almetta Fam  Exercises - Shoulder External Rotation and Scapular Retraction  with Resistance  - 1 x daily - 7 x weekly - 3 sets - 10 reps - Shoulder extension with resistance - Neutral  - 1 x daily - 7 x weekly - 3 sets - 10 reps - Standing Shoulder Row with Anchored Resistance  - 1 x daily - 7 x weekly - 3 sets - 10 reps - Standing Shoulder Flexion with Resistance  - 1 x daily - 7 x weekly - 2 sets - 10 reps - Standing Single Arm Shoulder Abduction with Resistance  - 1 x daily - 7 x weekly - 2 sets - 10 reps - Shoulder  Flexion Wall Slide with Towel  - 1 x daily - 7 x weekly - 3 sets - 10 reps  ASSESSMENT:  CLINICAL IMPRESSION: Patient is a 78 y.o. male who was seen today for physical therapy evaluation and treatment for bilateral shoulder pain. He reports the doctor wants to do a reverse total shoulder but wants him to go through PT first so insurance will accept the surgery. His doctor also told him he only needed to come to the evaluation, however I feel that PT would be beneficial for longer especially to work on his strength. Pt is very weak on the R side especially with flexion and abduction. The L side also has some strength deficits. We agreed on a short plan of care to work on some pre rehab strengthening up until he can get scheduled for the surgery.   OBJECTIVE IMPAIRMENTS: decreased ROM, decreased strength, improper body mechanics, and pain.   ACTIVITY LIMITATIONS: carrying, lifting, and reach over head  PARTICIPATION LIMITATIONS: cleaning, laundry, driving, community activity, and yard work  PERSONAL FACTORS: Time since onset of injury/illness/exacerbation are also affecting patient's functional outcome.   REHAB POTENTIAL: Fair    CLINICAL DECISION MAKING: Stable/uncomplicated  EVALUATION COMPLEXITY: Low  GOALS: Goals reviewed with patient? Yes  SHORT TERM GOALS: Target date: 03/10/24  Patient will be independent with initial HEP.  Baseline:  Goal status: INITIAL   LONG TERM GOALS: Target date: 03/31/24  Patient will be independent with advanced/ongoing HEP to improve outcomes and carryover.  Baseline:  Goal status: INITIAL  2.  Patient will report 50% improvement in bilateral shoulder pain to improve QOL.  Baseline:  Goal status: INITIAL  3.  Patient will be able to do overhead work for at least 2 mins without having to help support RUE   Baseline: has to use L hand to support R  Goal status: INITIAL  4.  Patient will demonstrate improved functional UE strength as demonstrated  by at 4/5 in all muscle groups. Baseline: very weak R side Goal status: INITIAL   PLAN:  PT FREQUENCY: 1-2x/week  PT DURATION: 6 weeks  PLANNED INTERVENTIONS: 97110-Therapeutic exercises, 97530- Therapeutic activity, 97112- Neuromuscular re-education, 97535- Self Care, [id number]- Manual therapy, 97016- Vasopneumatic device, Patient/Family education, Taping, Joint mobilization, Joint manipulation, Spinal manipulation, Spinal mobilization, Cryotherapy, and Moist heat  PLAN FOR NEXT SESSION: R shoulder strengthening    Almetta Fam, PT 02/18/2024, 4:32 PM

## 2024-02-18 ENCOUNTER — Ambulatory Visit: Attending: Orthopaedic Surgery

## 2024-02-18 DIAGNOSIS — M25511 Pain in right shoulder: Secondary | ICD-10-CM | POA: Diagnosis present

## 2024-02-18 DIAGNOSIS — M25611 Stiffness of right shoulder, not elsewhere classified: Secondary | ICD-10-CM | POA: Insufficient documentation

## 2024-02-18 DIAGNOSIS — G8929 Other chronic pain: Secondary | ICD-10-CM | POA: Insufficient documentation

## 2024-02-18 DIAGNOSIS — M6281 Muscle weakness (generalized): Secondary | ICD-10-CM | POA: Insufficient documentation

## 2024-02-23 ENCOUNTER — Ambulatory Visit

## 2024-07-05 ENCOUNTER — Other Ambulatory Visit (HOSPITAL_COMMUNITY): Payer: Self-pay

## 2024-07-05 ENCOUNTER — Other Ambulatory Visit (HOSPITAL_COMMUNITY): Payer: Self-pay | Admitting: *Deleted

## 2024-07-05 DIAGNOSIS — I4819 Other persistent atrial fibrillation: Secondary | ICD-10-CM

## 2024-07-05 MED ORDER — DILTIAZEM HCL ER COATED BEADS 180 MG PO CP24: 180.0000 mg | ORAL_CAPSULE | Freq: Every day | ORAL | 3 refills | Status: AC

## 2024-07-05 MED ORDER — FLECAINIDE ACETATE 50 MG PO TABS: 50.0000 mg | ORAL_TABLET | Freq: Two times a day (BID) | ORAL | 3 refills | Status: AC

## 2024-08-13 ENCOUNTER — Encounter (HOSPITAL_COMMUNITY): Payer: Self-pay | Admitting: Internal Medicine

## 2024-08-13 ENCOUNTER — Ambulatory Visit (HOSPITAL_COMMUNITY)
Admission: RE | Admit: 2024-08-13 | Discharge: 2024-08-13 | Disposition: A | Discharge status: Home / Self Care | Source: Ambulatory Visit | Attending: Internal Medicine | Admitting: Internal Medicine

## 2024-08-13 VITALS — BP 116/66 | HR 63 | Ht 69.0 in | Wt 199.4 lb

## 2024-08-13 DIAGNOSIS — D6869 Other thrombophilia: Secondary | ICD-10-CM

## 2024-08-13 DIAGNOSIS — I480 Paroxysmal atrial fibrillation: Secondary | ICD-10-CM

## 2024-08-13 DIAGNOSIS — Z5181 Encounter for therapeutic drug level monitoring: Secondary | ICD-10-CM | POA: Diagnosis not present

## 2024-08-13 DIAGNOSIS — Z79899 Other long term (current) drug therapy: Secondary | ICD-10-CM

## 2024-08-13 NOTE — Progress Notes (Signed)
 "   Primary Care Physician: Nikki Rams, Aliene, MD Primary Cardiologist: Lynwood Schilling, MD Electrophysiologist: None     Referring Physician: Dr. Schilling [name] is a 79 y.o. male with a history of T2DM, OSA, PMR, HLD, and paroxysmal atrial fibrillation who presents for consultation in the [facility]. Patient contacted office on 2/5 noting palpitations and watch stating he was in Afib for a period of time. Patient is on Eliquis  5 mg BID for a CHADS2VASC score of 3.  On follow up 11/04/23, he is here in Afib currently. He notes to possibly feel tired and have lack of energy at times. Cardiac monitor recently worn showed 33% Afib burden with longest episode almost 17 hours (average HR 93 bpm). No missed doses of Eliquis .   On follow up 02/11/24, patient is here for flecainide  surveillance. He is currently in NSR. No adverse effects from flecainide  or bleeding issues from Eliquis . He notes since beginning flecainide  his Apple watch states burden is 2% or less.   Follow-up 08/13/2024 for flecainide  surveillance.  Patient appears to be maintaining sinus rhythm.  He is taking flecainide  50 mg twice daily.  He has noted very low low A-fib burden and uses his Apple watch to monitor his rhythm.  Patient notes his PMR flared several months ago and he has been on a prednisone taper for about the past month. He is now down to prednisone 5 mg daily and taking methotrexate once a week. No bleeding issues on Eliquis .  Today, he denies symptoms of palpitations, chest pain, shortness of breath, orthopnea, PND, lower extremity edema, dizziness, presyncope, syncope, snoring, daytime somnolence, bleeding, or neurologic sequela. The patient is tolerating medications without difficulties and is otherwise without complaint today.    Atrial Fibrillation Risk Factors:  he does have symptoms or diagnosis of sleep apnea. he is compliant with CPAP therapy.  he has a BMI of Body  mass index is 29.45 kg/m.SABRA Filed Weights   08/13/24 1056  Weight: 90.4 kg     Current Outpatient Medications  Medication Sig Dispense Refill   apixaban  (ELIQUIS ) 5 MG TABS tablet Take 1 tablet (5 mg total) by mouth 2 (two) times daily. 180 tablet 2   Blood Glucose Monitoring Suppl (ONETOUCH VERIO FLEX SYSTEM) w/Device KIT as needed.     Cholecalciferol (VITAMIN D) 50 MCG (2000 UT) CAPS Take 1 capsule by mouth daily.     co-enzyme Q-10 30 MG capsule Take 30 mg by mouth daily.     diltiazem  (CARDIZEM  CD) 180 MG 24 hr capsule Take 1 capsule (180 mg total) by mouth daily. 90 capsule 3   flecainide  (TAMBOCOR ) 50 MG tablet Take 1 tablet (50 mg total) by mouth 2 (two) times daily. 180 tablet 3   fluticasone (FLONASE) 50 MCG/ACT nasal spray Place 1 spray into both nostrils as needed.     glucosamine-chondroitin 500-400 MG tablet Take 1 tablet by mouth 2 (two) times daily.     Lancets MISC as needed.     Magnesium 400 MG TABS Take 1 tablet by mouth daily.     metFORMIN (GLUCOPHAGE-XR) 500 MG 24 hr tablet Take 1 tablet by mouth daily with breakfast.     mupirocin ointment (BACTROBAN) 2 % Apply topically 2 (two) times daily as needed.     Omega-3 Fatty Acids (SALMON OIL-1000) 200 MG CAPS Take 2 capsules by mouth daily.     ONETOUCH VERIO test strip 1 each by Other route as needed.  Red Yeast Rice Extract (RED YEAST RICE PO) Take 3 tablets by mouth every morning.     saw palmetto 160 MG capsule Take 160 mg by mouth 2 (two) times daily.     Syringe/Needle, Disp, (SYRINGE 3CC/25GX1) 25G X 1 3 ML MISC Use with b12 injection.     tamsulosin  (FLOMAX ) 0.4 MG CAPS capsule Take 1 capsule (0.4 mg total) by mouth daily. 90 capsule 0   No current facility-administered medications for this encounter.    Atrial Fibrillation Management history:  Previous antiarrhythmic drugs: flecainide  Previous cardioversions: none Previous ablations: none Anticoagulation history: Eliquis    ROS- All systems are  reviewed and negative except as per the HPI above.  Physical Exam: BP 116/66   Pulse 63   Ht 5' 9 (1.753 m)   Wt 90.4 kg   BMI 29.45 kg/m   GEN- The patient is well appearing, alert and oriented x 3 today.   Neck - no JVD or carotid bruit noted Lungs- Clear to ausculation bilaterally, normal work of breathing Heart- Regular rate and rhythm, no murmurs, rubs or gallops, PMI not laterally displaced Extremities- no clubbing, cyanosis, or edema Skin - no rash or ecchymosis noted    EKG today demonstrates  EKG Interpretation Date/Time:  Friday August 13 2024 10:59:25 EST Ventricular Rate:  63 PR Interval:  162 QRS Duration:  88 QT Interval:  394 QTC Calculation: 403 R Axis:   49  Text Interpretation: Normal sinus rhythm Possible Anterior infarct (cited on or before 04-Nov-2023) Abnormal ECG When compared with ECG of 11-Feb-2024 10:36, No significant change was found Confirmed by Terra Pac ([id number]) on 08/13/2024 11:02:04 AM    Echo 02/13/21 demonstrated  1. Left ventricular ejection fraction, by estimation, is 55 to 60%. The  left ventricle has normal function. The left ventricle has no regional  wall motion abnormalities. Left ventricular diastolic parameters were  normal.   2. Right ventricular systolic function is normal. The right ventricular  size is normal. There is normal pulmonary artery systolic pressure.   3. The mitral valve is normal in structure. No evidence of mitral valve  regurgitation. No evidence of mitral stenosis.   4. The aortic valve is tricuspid. Aortic valve regurgitation is not  visualized. No aortic stenosis is present.   5. The inferior vena cava is normal in size with greater than 50%  respiratory variability, suggesting right atrial pressure of 3 mmHg.   Cardiac monitor 08/2023: HR 43-190, average 73 bpm. Atrial fibrillation detected. Rare supraventricular ectopy. Rare ventricular ectopy.  33% atrial fibrillation burden, average heart rate 93  bpm, longest episode 16 hours 48 minutes No symptom trigger episodes.     Will [facility] Cardiac Electrophysiology  CHA2DS2-VASc Score = 3  The patient's score is based upon: CHF History: 0 HTN History: 0 Diabetes History: 1 Stroke History: 0 Vascular Disease History: 0 Age Score: 2 Gender Score: 0       ASSESSMENT AND PLAN: Paroxysmal Atrial Fibrillation (ICD10:  I48.0) The patient's CHA2DS2-VASc score is 3, indicating a 3.2% annual risk of stroke.    Patient is currently in NSR. He is overall stable with current management so will not make any changes at this time. He will continue to monitor Afib via Apple watch.  High risk medication monitoring (ICD10: J342684) Patient requires ongoing monitoring for anti-arrhythmic medication which has the potential to cause life threatening arrhythmias or AV block. ECG intervals are stable. Continue flecainide  50 mg BID. Continue diltiazem  180 mg daily.  Secondary Hypercoagulable State (ICD10:  D68.69) The patient is at significant risk for stroke/thromboembolism based upon his CHA2DS2-VASc Score of 3.  Continue Apixaban  (Eliquis ).  No missed doses.    Follow up 6 months for flecainide  surveillance.   Terra Pac, PA-C  [facility] [address] [place], [place] [address] [phone]  "

## 2025-02-11 ENCOUNTER — Ambulatory Visit (HOSPITAL_COMMUNITY): Admitting: Internal Medicine
